# Patient Record
Sex: Female | Born: 1970 | Race: White | Hispanic: No | Marital: Married | Smoking: Never smoker
Health system: Southern US, Community
[De-identification: ages and names within clinical notes are randomized; demographics above are authoritative.]

## PROBLEM LIST (undated history)

## (undated) DIAGNOSIS — E039 Hypothyroidism, unspecified: Secondary | ICD-10-CM

## (undated) DIAGNOSIS — E663 Overweight: Secondary | ICD-10-CM

## (undated) DIAGNOSIS — C801 Malignant (primary) neoplasm, unspecified: Secondary | ICD-10-CM

## (undated) DIAGNOSIS — C73 Malignant neoplasm of thyroid gland: Secondary | ICD-10-CM

## (undated) DIAGNOSIS — R609 Edema, unspecified: Secondary | ICD-10-CM

## (undated) DIAGNOSIS — E785 Hyperlipidemia, unspecified: Secondary | ICD-10-CM

## (undated) HISTORY — PX: CHOLECYSTECTOMY: SHX55

## (undated) HISTORY — DX: Hyperlipidemia, unspecified: E78.5

## (undated) HISTORY — PX: TUBAL LIGATION: SHX77

## (undated) HISTORY — PX: TONSILECTOMY, ADENOIDECTOMY, BILATERAL MYRINGOTOMY AND TUBES: SHX2538

## (undated) HISTORY — DX: Malignant neoplasm of thyroid gland: C73

## (undated) HISTORY — DX: Overweight: E66.3

## (undated) HISTORY — DX: Hypothyroidism, unspecified: E03.9

## (undated) HISTORY — PX: VAGINAL HYSTERECTOMY: SUR661

## (undated) HISTORY — PX: THYROIDECTOMY: SHX17

## (undated) HISTORY — DX: Edema, unspecified: R60.9

---

## 2009-02-19 ENCOUNTER — Ambulatory Visit: Payer: Self-pay | Admitting: Otolaryngology

## 2015-02-08 DIAGNOSIS — C801 Malignant (primary) neoplasm, unspecified: Secondary | ICD-10-CM

## 2015-02-08 HISTORY — DX: Malignant (primary) neoplasm, unspecified: C80.1

## 2015-07-28 ENCOUNTER — Other Ambulatory Visit: Payer: Self-pay | Admitting: Family Medicine

## 2015-07-28 DIAGNOSIS — Z1231 Encounter for screening mammogram for malignant neoplasm of breast: Secondary | ICD-10-CM

## 2015-07-29 ENCOUNTER — Other Ambulatory Visit: Payer: Self-pay | Admitting: Family Medicine

## 2015-07-29 DIAGNOSIS — E049 Nontoxic goiter, unspecified: Secondary | ICD-10-CM

## 2015-08-04 ENCOUNTER — Ambulatory Visit
Admission: RE | Admit: 2015-08-04 | Discharge: 2015-08-04 | Disposition: A | Discharge status: Home / Self Care | Payer: BLUE CROSS/BLUE SHIELD | Source: Ambulatory Visit | Attending: Family Medicine | Admitting: Family Medicine

## 2015-08-04 DIAGNOSIS — E049 Nontoxic goiter, unspecified: Secondary | ICD-10-CM

## 2015-08-18 ENCOUNTER — Ambulatory Visit: Payer: Self-pay | Attending: Family Medicine

## 2015-10-05 DIAGNOSIS — E041 Nontoxic single thyroid nodule: Secondary | ICD-10-CM | POA: Insufficient documentation

## 2016-04-08 ENCOUNTER — Other Ambulatory Visit: Payer: Self-pay | Admitting: Family Medicine

## 2016-04-08 DIAGNOSIS — Z1231 Encounter for screening mammogram for malignant neoplasm of breast: Secondary | ICD-10-CM

## 2016-05-10 ENCOUNTER — Encounter: Payer: Self-pay | Admitting: Radiology

## 2016-05-10 ENCOUNTER — Ambulatory Visit
Admission: RE | Admit: 2016-05-10 | Discharge: 2016-05-10 | Disposition: A | Discharge status: Home / Self Care | Payer: BLUE CROSS/BLUE SHIELD | Source: Ambulatory Visit | Attending: Family Medicine | Admitting: Family Medicine

## 2016-05-10 DIAGNOSIS — Z1231 Encounter for screening mammogram for malignant neoplasm of breast: Secondary | ICD-10-CM | POA: Diagnosis present

## 2016-05-10 DIAGNOSIS — N6320 Unspecified lump in the left breast, unspecified quadrant: Secondary | ICD-10-CM | POA: Diagnosis not present

## 2016-05-10 HISTORY — DX: Malignant (primary) neoplasm, unspecified: C80.1

## 2016-05-12 ENCOUNTER — Other Ambulatory Visit: Payer: Self-pay | Admitting: Family Medicine

## 2016-05-12 DIAGNOSIS — N6320 Unspecified lump in the left breast, unspecified quadrant: Secondary | ICD-10-CM

## 2016-05-12 DIAGNOSIS — R928 Other abnormal and inconclusive findings on diagnostic imaging of breast: Secondary | ICD-10-CM

## 2016-05-12 DIAGNOSIS — N6489 Other specified disorders of breast: Secondary | ICD-10-CM

## 2016-05-19 ENCOUNTER — Ambulatory Visit
Admission: RE | Admit: 2016-05-19 | Discharge: 2016-05-19 | Disposition: A | Discharge status: Home / Self Care | Payer: BLUE CROSS/BLUE SHIELD | Source: Ambulatory Visit | Attending: Family Medicine | Admitting: Family Medicine

## 2016-05-19 DIAGNOSIS — R928 Other abnormal and inconclusive findings on diagnostic imaging of breast: Secondary | ICD-10-CM

## 2016-05-19 DIAGNOSIS — N6489 Other specified disorders of breast: Secondary | ICD-10-CM | POA: Insufficient documentation

## 2016-05-19 DIAGNOSIS — N6320 Unspecified lump in the left breast, unspecified quadrant: Secondary | ICD-10-CM | POA: Diagnosis not present

## 2016-06-09 ENCOUNTER — Other Ambulatory Visit: Payer: Self-pay | Admitting: Family Medicine

## 2016-06-09 DIAGNOSIS — N6320 Unspecified lump in the left breast, unspecified quadrant: Secondary | ICD-10-CM

## 2016-11-21 ENCOUNTER — Ambulatory Visit
Admission: RE | Admit: 2016-11-21 | Discharge: 2016-11-21 | Disposition: A | Discharge status: Home / Self Care | Payer: BLUE CROSS/BLUE SHIELD | Source: Ambulatory Visit | Attending: Family Medicine | Admitting: Family Medicine

## 2016-11-21 DIAGNOSIS — N6320 Unspecified lump in the left breast, unspecified quadrant: Secondary | ICD-10-CM | POA: Insufficient documentation

## 2016-11-24 ENCOUNTER — Other Ambulatory Visit: Payer: Self-pay | Admitting: Family Medicine

## 2016-11-24 DIAGNOSIS — N6320 Unspecified lump in the left breast, unspecified quadrant: Secondary | ICD-10-CM

## 2017-05-19 ENCOUNTER — Ambulatory Visit
Admission: RE | Admit: 2017-05-19 | Discharge: 2017-05-19 | Disposition: A | Discharge status: Home / Self Care | Payer: BLUE CROSS/BLUE SHIELD | Source: Ambulatory Visit | Attending: Family Medicine | Admitting: Family Medicine

## 2017-05-19 DIAGNOSIS — N6324 Unspecified lump in the left breast, lower inner quadrant: Secondary | ICD-10-CM | POA: Diagnosis not present

## 2017-05-19 DIAGNOSIS — N6320 Unspecified lump in the left breast, unspecified quadrant: Secondary | ICD-10-CM | POA: Diagnosis present

## 2018-05-05 IMAGING — US US BREAST*L* LIMITED INC AXILLA
1 series · 7 of 7 positions shown · non-contrast
Comparison: Previous exam(s).

CLINICAL DATA: 46-year-old female presenting for first six-month
follow-up of a probably benign left breast mass.

EXAM:
2D DIGITAL DIAGNOSTIC LEFT MAMMOGRAM WITH CAD AND ADJUNCT TOMO
ULTRASOUND LEFT BREAST

[Series 1: us breast*left* limited inc axilla · 0.05mm/px · 7 of 7 slices shown]
[im 1/7]
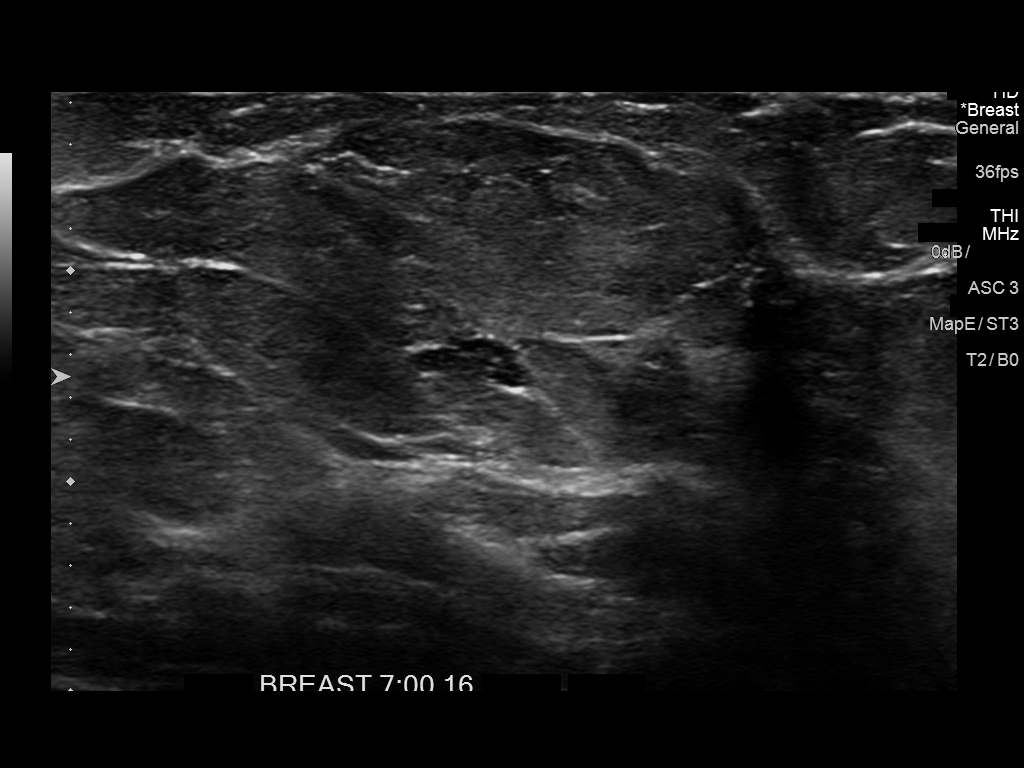
[im 2/7]
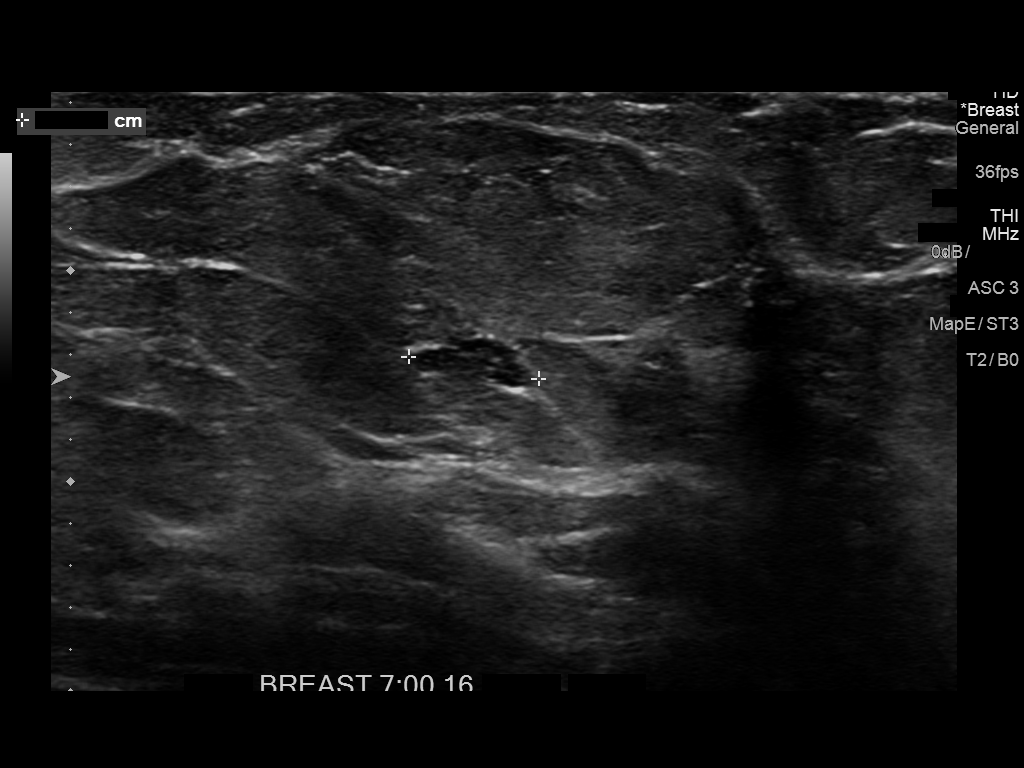
[im 3/7]
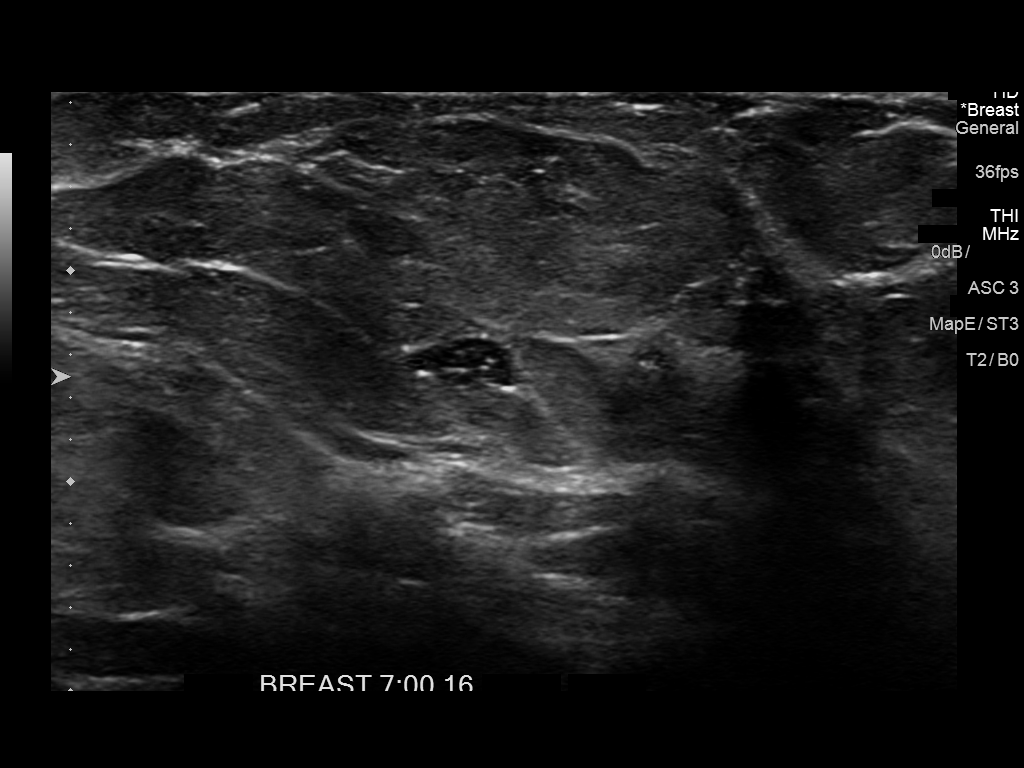
[im 4/7]
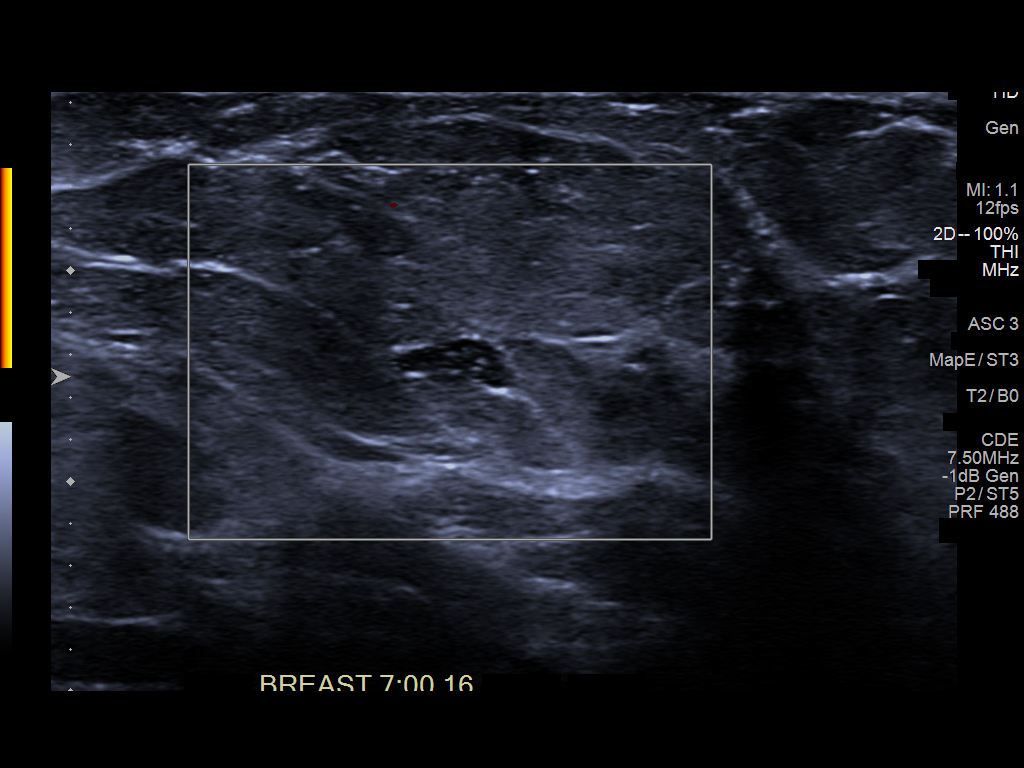
[im 5/7]
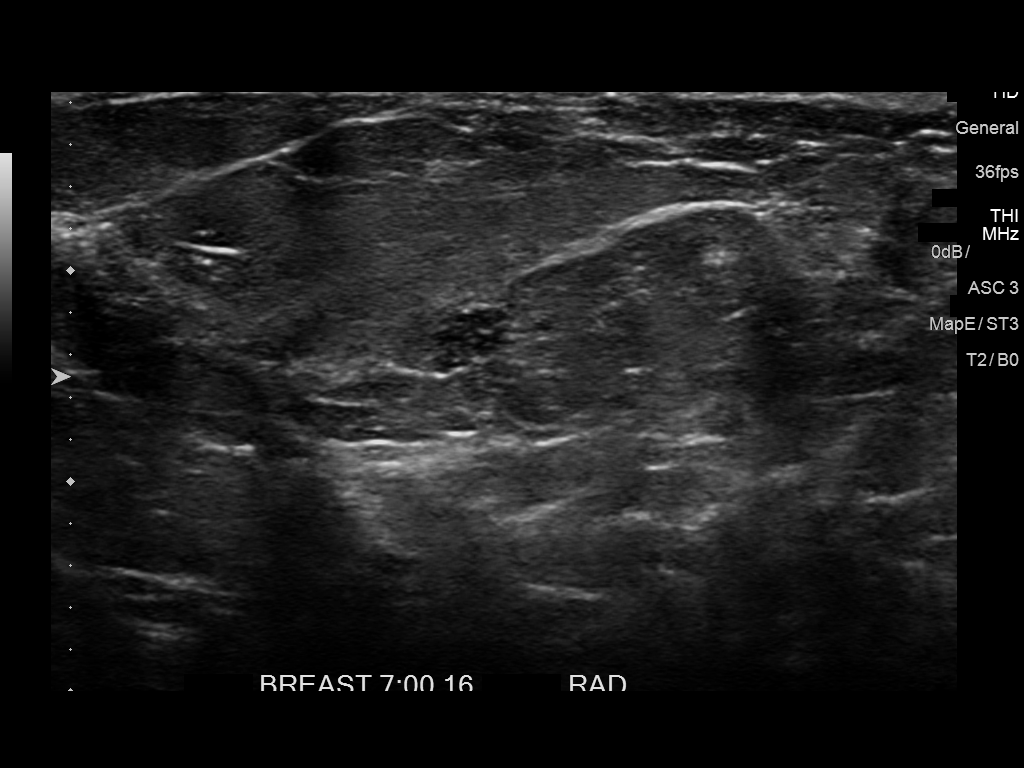
[im 6/7]
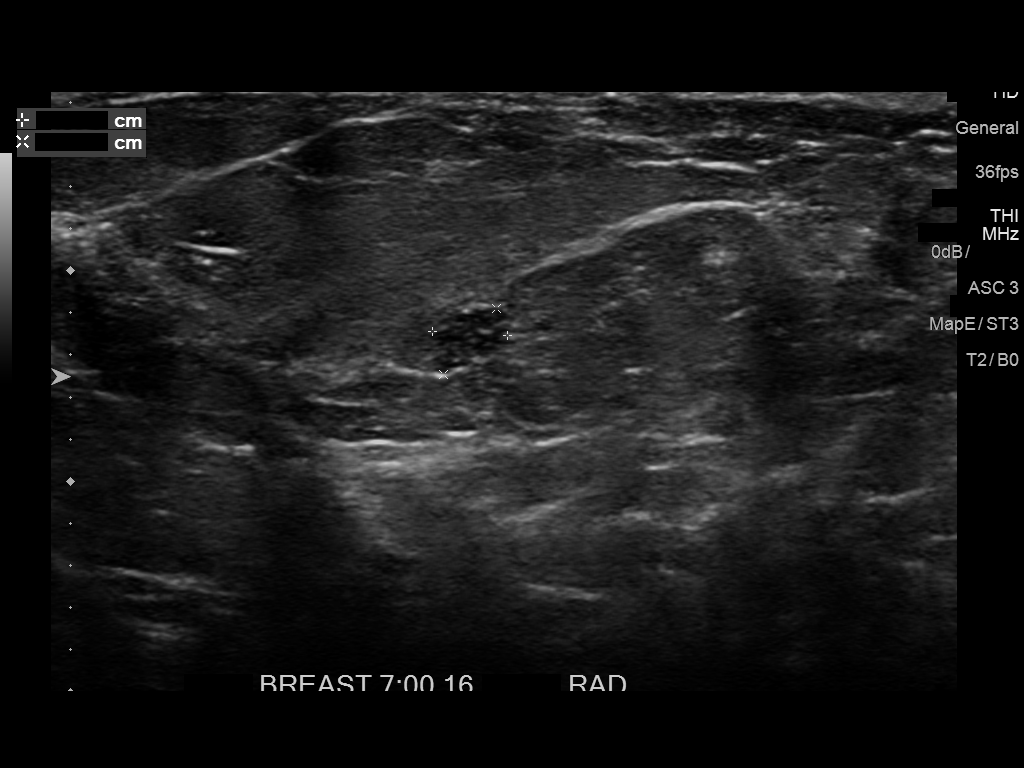
[im 7/7]
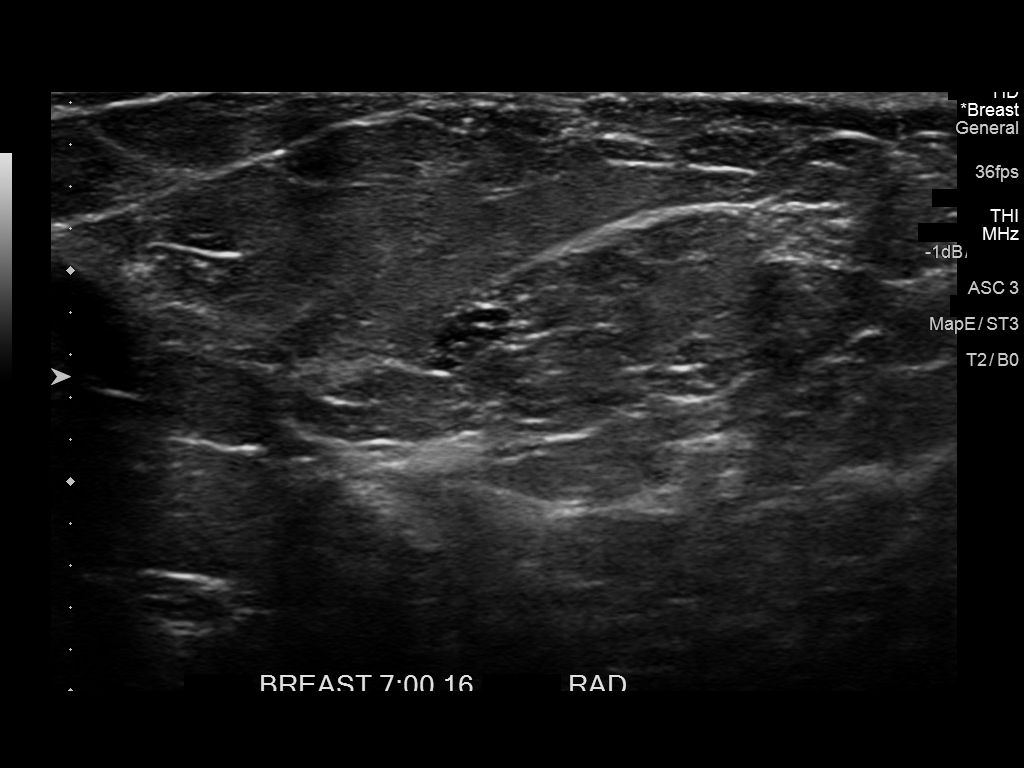

[7 of 7 positions shown; findings below may reference images not displayed]

ACR Breast Density Category c: The breast tissue is heterogeneously
dense, which may obscure small masses.
FINDINGS: A stable circumscribed masses identified in the inferior medial left
breast posteriorly. No other suspicious mammographic findings are
identified.

Mammographic images were processed with CAD.

Targeted ultrasound is performed, showing a stable, circumscribed
heterogeneous mass at the 7 o'clock position 16 cm from the nipple.
It measures 0.6 x 0.4 x 0.4 cm. There is no significant internal
vascularity.
IMPRESSION: Stable, probably benign left breast mass likely representing a cyst
cluster. Continued six-month follow-up recommended to coincide with
the patient's annual bilateral mammogram.

RECOMMENDATION:
Diagnostic bilateral mammogram and left breast ultrasound in 6
months.

I have discussed the findings and recommendations with the patient.
Results were also provided in writing at the conclusion of the
visit. If applicable, a reminder letter will be sent to the patient
regarding the next appointment.

BI-RADS CATEGORY  3: Probably benign.

## 2018-10-05 ENCOUNTER — Other Ambulatory Visit: Payer: Self-pay

## 2018-10-31 IMAGING — US US BREAST*L* LIMITED INC AXILLA
1 series · 8 of 8 positions shown · non-contrast
Comparison: Previous exam(s).

CLINICAL DATA: Follow-up of probably benign left breast 7 o'clock
nodule.

EXAM:
DIGITAL DIAGNOSTIC BILATERAL MAMMOGRAM WITH CAD AND TOMO
ULTRASOUND LEFT BREAST

[Series 1: us breast*left* limited inc axilla · 0.04mm/px · 8 of 8 slices shown]
[im 1/8]
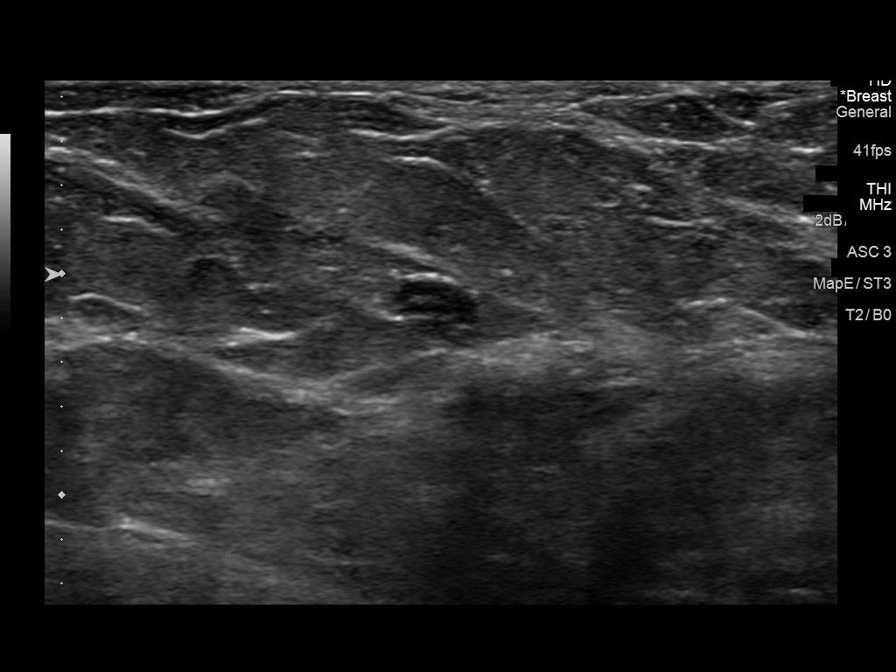
[im 2/8]
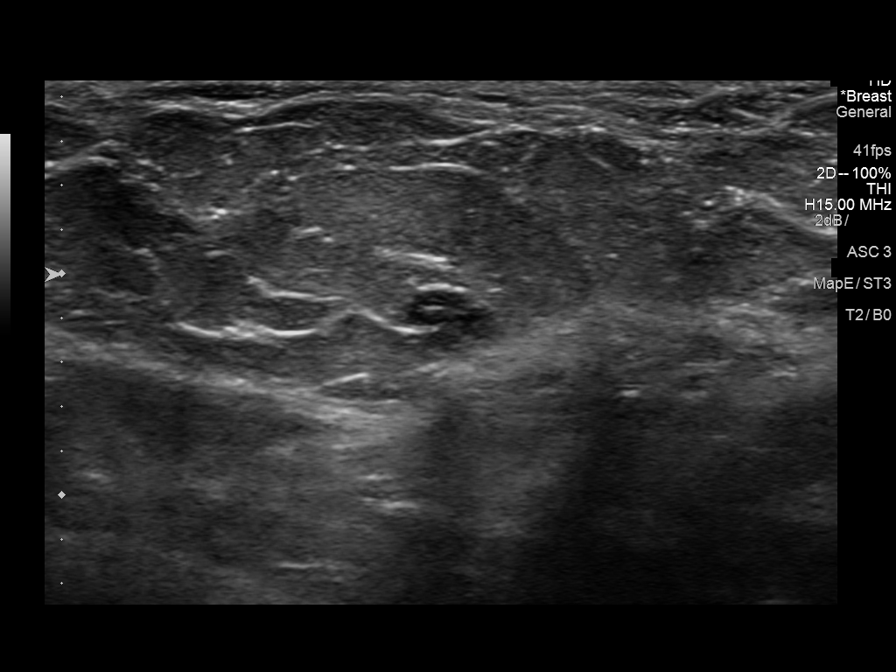
[im 3/8]
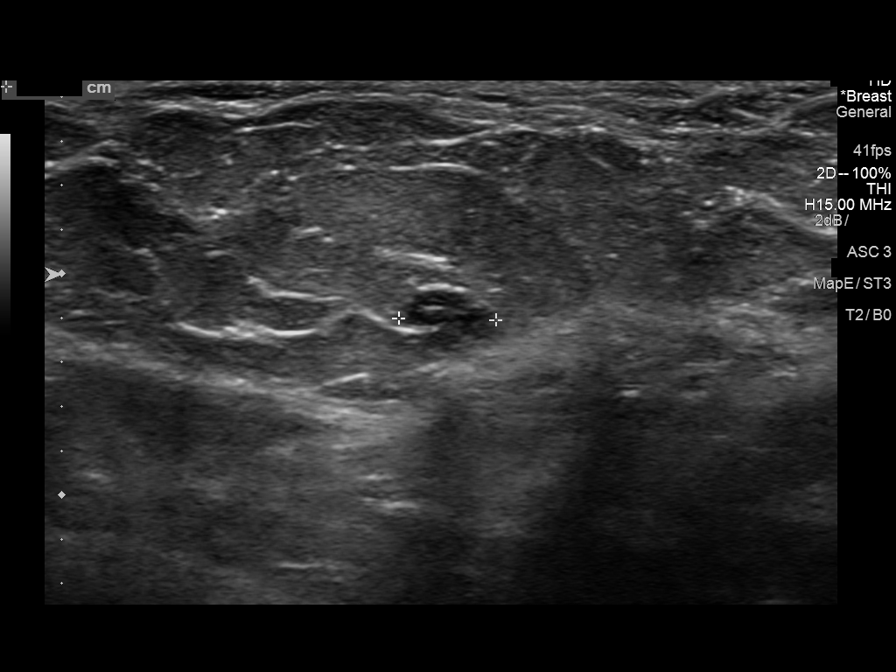
[im 4/8]
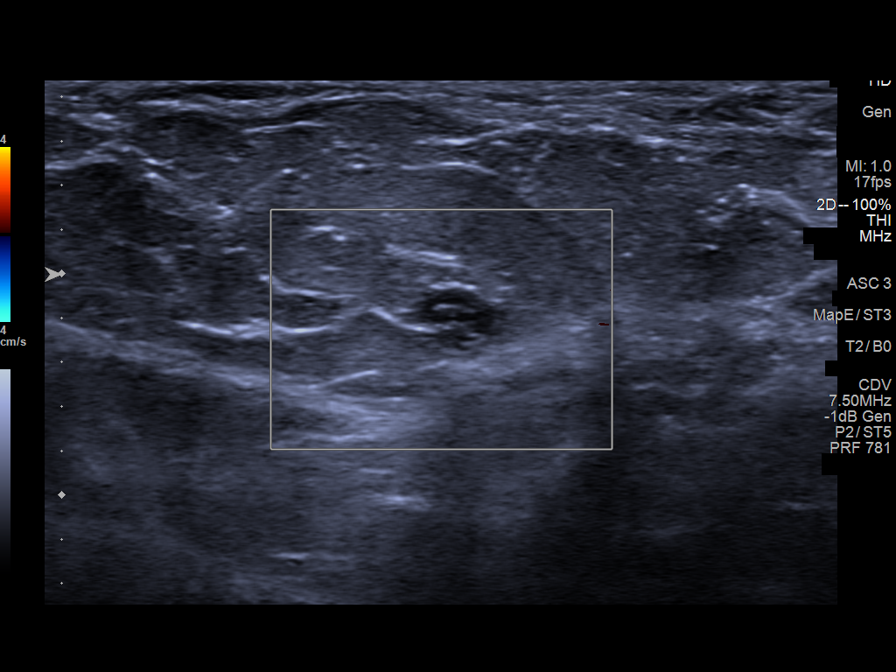
[im 5/8]
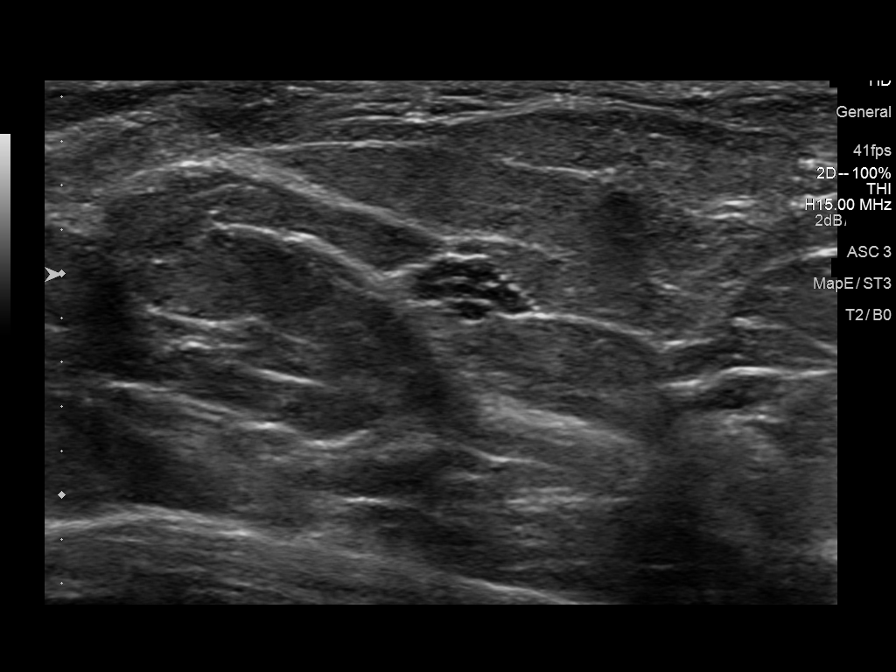
[im 6/8]
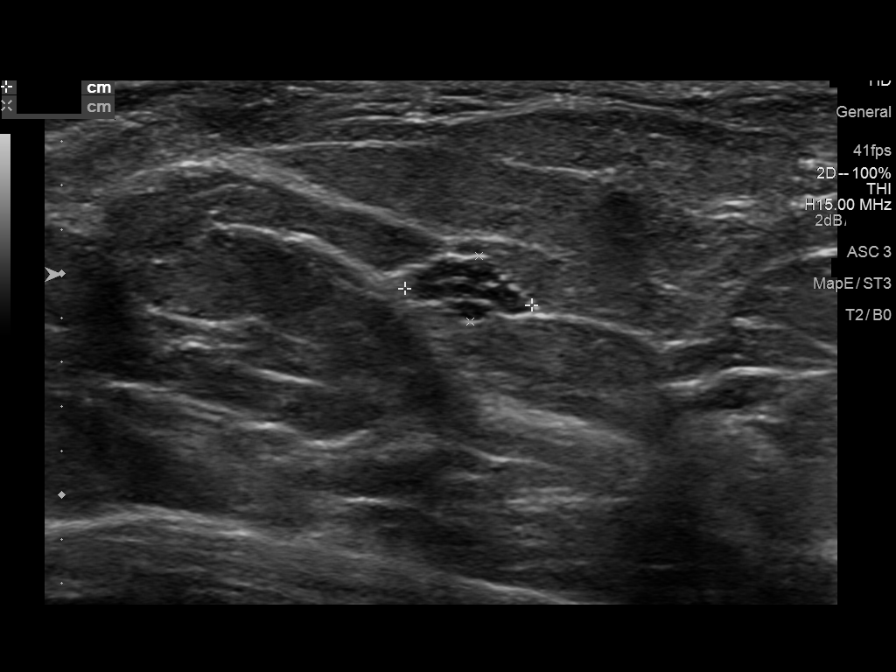
[im 7/8]
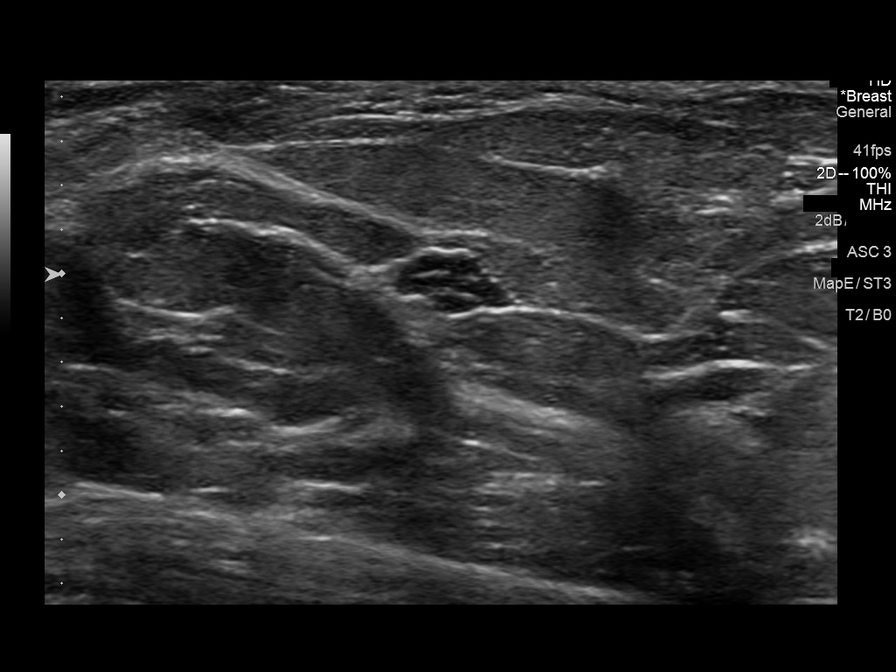
[im 8/8]
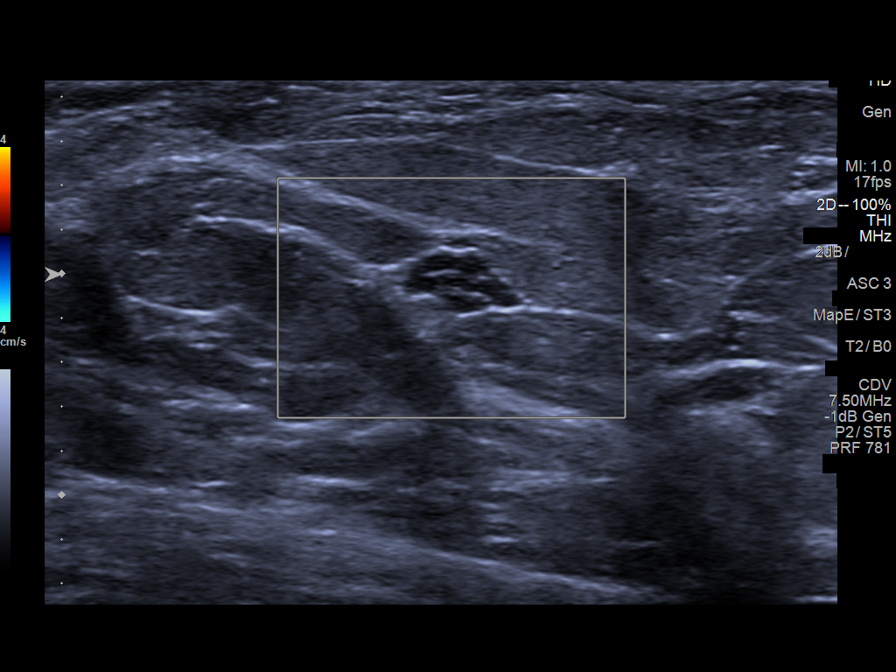

[8 of 8 positions shown; findings below may reference images not displayed]

ACR Breast Density Category c: The breast tissue is heterogeneously
dense, which may obscure small masses.
FINDINGS: The mammographically, there are no suspicious masses, areas of
architectural distortion or microcalcifications in either breast.
The subcentimeter nodule in the left lateral breast seen on [facility]
and November 2016 mammogram is no longer apparent.

Mammographic images were processed with CAD.

On physical exam, no suspicious masses are palpated.

Targeted ultrasound is performed, showing slightly decreased in size
cluster of microcysts in the left 7 o'clock breast 16 cm from the
nipple measuring 0.6 x 0.3 x 0.4 cm. The decrease in size supports
benign causes.
IMPRESSION: Benign left breast probable cluster of microcysts.

No mammographic evidence of malignancy in either breast.

RECOMMENDATION:
Screening mammogram in one year.(Code:VF-7-8JY)

I have discussed the findings and recommendations with the patient.
Results were also provided in writing at the conclusion of the
visit. If applicable, a reminder letter will be sent to the patient
regarding the next appointment.

BI-RADS CATEGORY  2: Benign.

## 2019-06-10 ENCOUNTER — Other Ambulatory Visit: Payer: Self-pay

## 2019-06-27 ENCOUNTER — Other Ambulatory Visit: Payer: Self-pay

## 2019-06-28 MED ORDER — LEVOTHYROXINE SODIUM 175 MCG PO TABS: 175.0000 ug | ORAL_TABLET | Freq: Every day | ORAL | 1 refills | Status: DC

## 2019-07-09 ENCOUNTER — Ambulatory Visit: Payer: Self-pay

## 2019-07-09 ENCOUNTER — Other Ambulatory Visit: Payer: Self-pay

## 2019-07-09 DIAGNOSIS — Z0184 Encounter for antibody response examination: Secondary | ICD-10-CM

## 2019-07-09 DIAGNOSIS — Z0000 Encounter for general adult medical examination without abnormal findings: Secondary | ICD-10-CM

## 2019-07-09 LAB — POCT URINALYSIS DIPSTICK
Bilirubin, UA: NEGATIVE
Blood, UA: NEGATIVE
Glucose, UA: NEGATIVE
Ketones, UA: NEGATIVE
Leukocytes, UA: NEGATIVE
Nitrite, UA: NEGATIVE
Protein, UA: NEGATIVE
Spec Grav, UA: >=1.03 — AB (ref 1.010–1.025)
Urobilinogen, UA: 0.2 E.U./dL
pH, UA: 6 (ref 5.0–8.0)

## 2019-07-10 LAB — CMP12+LP+TP+TSH+6AC+CBC/D/PLT
ALT: 14 IU/L (ref 0–32)
AST: 13 IU/L (ref 0–40)
Albumin/Globulin Ratio: 1.3 (ref 1.2–2.2)
Albumin: 4 g/dL (ref 3.8–4.8)
Alkaline Phosphatase: 49 IU/L (ref 48–121)
BUN/Creatinine Ratio: 12 (ref 9–23)
BUN: 11 mg/dL (ref 6–24)
Basophils Absolute: 0 10*3/uL (ref 0.0–0.2)
Basos: 0 %
Bilirubin Total: 0.3 mg/dL (ref 0.0–1.2)
Calcium: 9.1 mg/dL (ref 8.7–10.2)
Chloride: 103 mmol/L (ref 96–106)
Chol/HDL Ratio: 4.6 ratio — ABNORMAL HIGH (ref 0.0–4.4)
Cholesterol, Total: 223 mg/dL — ABNORMAL HIGH (ref 100–199)
Creatinine, Ser: 0.91 mg/dL (ref 0.57–1.00)
EOS (ABSOLUTE): 0.1 10*3/uL (ref 0.0–0.4)
Eos: 1 %
Estimated CHD Risk: 1.2 times avg. — ABNORMAL HIGH (ref 0.0–1.0)
Free Thyroxine Index: 2.6 (ref 1.2–4.9)
GFR calc Af Amer: 86 mL/min/{1.73_m2} (ref >59)
GFR calc non Af Amer: 75 mL/min/{1.73_m2} (ref >59)
GGT: 7 IU/L (ref 0–60)
Globulin, Total: 3 g/dL (ref 1.5–4.5)
Glucose: 92 mg/dL (ref 65–99)
HDL: 48 mg/dL (ref >39)
Hematocrit: 39.6 % (ref 34.0–46.6)
Hemoglobin: 13.2 g/dL (ref 11.1–15.9)
Immature Grans (Abs): 0 10*3/uL (ref 0.0–0.1)
Immature Granulocytes: 0 %
Iron: 62 ug/dL (ref 27–159)
LDH: 188 IU/L (ref 119–226)
LDL Chol Calc (NIH): 150 mg/dL — ABNORMAL HIGH (ref 0–99)
Lymphocytes Absolute: 2.6 10*3/uL (ref 0.7–3.1)
Lymphs: 36 %
MCH: 30.3 pg (ref 26.6–33.0)
MCHC: 33.3 g/dL (ref 31.5–35.7)
MCV: 91 fL (ref 79–97)
Monocytes Absolute: 0.5 10*3/uL (ref 0.1–0.9)
Monocytes: 7 %
Neutrophils Absolute: 4 10*3/uL (ref 1.4–7.0)
Neutrophils: 56 %
Phosphorus: 3.8 mg/dL (ref 3.0–4.3)
Platelets: 343 10*3/uL (ref 150–450)
Potassium: 4.4 mmol/L (ref 3.5–5.2)
RBC: 4.35 x10E6/uL (ref 3.77–5.28)
RDW: 12.6 % (ref 11.7–15.4)
Sodium: 141 mmol/L (ref 134–144)
T3 Uptake Ratio: 29 % (ref 24–39)
T4, Total: 9.1 ug/dL (ref 4.5–12.0)
TSH: 0.733 u[IU]/mL (ref 0.450–4.500)
Total Protein: 7 g/dL (ref 6.0–8.5)
Triglycerides: 140 mg/dL (ref 0–149)
Uric Acid: 4.5 mg/dL (ref 2.6–6.2)
VLDL Cholesterol Cal: 25 mg/dL (ref 5–40)
WBC: 7.3 10*3/uL (ref 3.4–10.8)

## 2019-07-10 LAB — T4, FREE: Free T4: 1.56 ng/dL (ref 0.82–1.77)

## 2019-07-10 LAB — T3, FREE: T3, Free: 2.8 pg/mL (ref 2.0–4.4)

## 2019-07-29 ENCOUNTER — Other Ambulatory Visit: Payer: Self-pay

## 2019-07-29 ENCOUNTER — Ambulatory Visit: Payer: Self-pay | Admitting: Emergency Medicine

## 2019-07-29 VITALS — BP 140/85 | HR 72 | Temp 98.5°F | Resp 16 | Ht 68.0 in | Wt 252.0 lb

## 2019-07-29 DIAGNOSIS — Z0000 Encounter for general adult medical examination without abnormal findings: Secondary | ICD-10-CM

## 2019-07-29 MED ORDER — FUROSEMIDE 20 MG PO TABS: 20.0000 mg | ORAL_TABLET | ORAL | 2 refills | Status: DC | PRN

## 2019-07-29 NOTE — Progress Notes (Signed)
I have reviewed the triage vital signs and the nursing notes.   HISTORY  Chief Complaint Employment Physical  HPI Alexa Flores is a 49 y.o. female is here for physical exam.  Patient states that she needs a refill of her Lasix as needed her ankle swelling.  She has been on it before without any difficulties.  Patient also continues to take Celebrex for muscle skeletal pain.       Past Medical History:  Diagnosis Date   Cancer ([place]) 2017   thyroid ca.  Surgery only. No tx needed.     Patient Active Problem List   Diagnosis Date Noted   Thyroid nodule 10/05/2015    No past surgical history on file.  Prior to Admission medications   Medication Sig Start Date End Date Taking? Authorizing Provider  Acetaminophen 500 MG capsule Take 2 tablets by mouth as needed.   Yes [provider]  calcium carbonate (TUMS EX) 750 MG chewable tablet Chew 2 tablets by mouth once. 11/24/15  Yes [provider]  celecoxib (CELEBREX) 100 MG capsule Take 100 mg by mouth 2 (two) times daily. 06/02/19  Yes [provider]  cetirizine (ZYRTEC) 10 MG tablet Take 10 mg by mouth once.   Yes [provider]  Cholecalciferol 125 MCG (5000 UT) TABS Take 10,000 Units by mouth once.   Yes [provider]  furosemide (LASIX) 20 MG tablet Take 1 tablet (20 mg total) by mouth as needed. 07/29/19  Yes Johnn Hai, PA-C  levothyroxine (SYNTHROID) 175 MCG tablet Take 1 tablet (175 mcg total) by mouth daily. 06/28/19  Yes Sable Feil, PA-C  Lysine 500 MG TABS Take 1 tablet by mouth daily.   Yes [provider]  Omega-3 Fatty Acids (FISH OIL) 500 MG CAPS Take 1 capsule by mouth 1 day or 1 dose.   Yes [provider]  vitamin E 180 MG (400 UNITS) capsule Take 400 Units by mouth 1 day or 1 dose.   Yes [provider]    Allergies Azithromycin and Other  No family history on file.  Social History Social History   Tobacco Use    Smoking status: Not on file  Substance Use Topics   Alcohol use: Not on file   Drug use: Not on file    Review of Systems Constitutional: No fever/chills Eyes: No visual changes. ENT: No sore throat. Cardiovascular: Denies chest pain. Respiratory: Denies shortness of breath. Gastrointestinal: No abdominal pain.  No nausea, no vomiting.   Genitourinary: Negative for dysuria. Musculoskeletal: Generalized aching due to degenerative arthritis. Skin: Negative for rash. Neurological: Negative for headaches, focal weakness or numbness.  ____________________________________________   PHYSICAL EXAM:  Constitutional: Alert and oriented. Well appearing and in no acute distress. Eyes: Conjunctivae are normal. PERRL. EOMI. Head: Atraumatic. Nose: No congestion/rhinnorhea. Neck: No stridor.   Cardiovascular: Normal rate, regular rhythm. Grossly normal heart sounds.  Good peripheral circulation. Respiratory: Normal respiratory effort.  No retractions. Lungs CTAB. Gastrointestinal: Soft and nontender. No distention.  Bowel sounds normoactive x4 quadrants. Musculoskeletal: Nontender thoracic or lumbar spine to palpation posteriorly.  Patient is able move upper and lower extremities without any difficulty.  Normal gait was noted.  No deformity of joints.  Bilateral ankles there is some mild soft tissue edema without pitting. Neurologic:  Normal speech and language. No gross focal neurologic deficits are appreciated. No gait instability. Skin:  Skin is warm, dry and intact. No rash noted. Psychiatric: Mood and affect  are normal. Speech and behavior are normal.  ____________________________________________   LABS (all labs ordered are listed, but only abnormal results are displayed)  Labs were discussed with patient. ____________________________________________  EKG  Sinus rhythm with ventricular rate of  70. ____________________________________________ ____________________________________________   FINAL CLINICAL IMPRESSION(S)   Wellness physical.   ED Discharge Orders         Ordered    EKG 12-Lead  Status:  Canceled        07/29/19 1336    EKG 12-Lead        07/29/19 1345    furosemide (LASIX) 20 MG tablet  As needed     Discontinue  Reprint     07/29/19 1407           Note:  This document was prepared using Dragon voice recognition software and may include unintentional dictation errors.

## 2019-07-31 LAB — SPECIMEN STATUS REPORT

## 2019-07-31 LAB — RABIES NEUT.ABS TITRAT.(RFFIT)

## 2019-08-02 DIAGNOSIS — M2550 Pain in unspecified joint: Secondary | ICD-10-CM | POA: Diagnosis not present

## 2019-08-02 DIAGNOSIS — M542 Cervicalgia: Secondary | ICD-10-CM | POA: Diagnosis not present

## 2019-08-02 DIAGNOSIS — M545 Low back pain: Secondary | ICD-10-CM | POA: Diagnosis not present

## 2019-08-02 DIAGNOSIS — M7918 Myalgia, other site: Secondary | ICD-10-CM | POA: Diagnosis not present

## 2019-11-18 ENCOUNTER — Ambulatory Visit: Payer: Self-pay

## 2019-11-18 ENCOUNTER — Other Ambulatory Visit: Payer: Self-pay

## 2019-11-18 DIAGNOSIS — Z0184 Encounter for antibody response examination: Secondary | ICD-10-CM

## 2019-11-18 DIAGNOSIS — Z23 Encounter for immunization: Secondary | ICD-10-CM

## 2019-11-18 MED ORDER — RABIES VACCINE, PCEC IM SUSR
1.0000 mL | Freq: Once | INTRAMUSCULAR | Status: AC
  Administered 2019-11-18: 1 mL via INTRAMUSCULAR

## 2019-11-18 NOTE — Progress Notes (Signed)
Annebelle had Rabies titer on 07/14/19.  Results were >/=0.1 Comment:  Less than 0.1 IU/mL:  Below detection limit >/= 0.1 IU/mL  Kiaraliz already had Rabies pre-vaccine series when pre-employment completed on 04/09/2013. Documentation on physical form states she completed the series in 2013 & had a + Rabies titer in 2014. Because she never supplied [place] a copy of the above information, we obtained a Rabies titer on 05/13/13 & results were ,0.1 IU/mL Less than 0.1 IU/mL : Below detection limit. Received a Rabies booster from our clinic on 08/16/13. Rabies titer (after receiving the booster) on 11/19/13 >/=0.5 IU/mL (>/= 0.5 IU/mL: Equal to or above 0.5 IU/mL)  Presents to clinic today for Rabies booster.  AMD

## 2020-02-11 ENCOUNTER — Other Ambulatory Visit: Payer: Self-pay

## 2020-02-11 DIAGNOSIS — Z1152 Encounter for screening for COVID-19: Secondary | ICD-10-CM

## 2020-02-13 LAB — SARS-COV-2, NAA 2 DAY TAT

## 2020-02-13 LAB — NOVEL CORONAVIRUS, NAA: SARS-CoV-2, NAA: NOT DETECTED

## 2020-06-05 NOTE — Progress Notes (Signed)
Scheduled to complete physical 06/15/20 with Randel Pigg, PA-C.  AMD  Large busted - hard to get leads I & II clear Compared ECG to last year's ECG - same No distress - no CP, no SOB  AMD

## 2020-06-08 ENCOUNTER — Other Ambulatory Visit: Payer: Self-pay

## 2020-06-08 ENCOUNTER — Ambulatory Visit: Payer: Self-pay

## 2020-06-08 DIAGNOSIS — Z0000 Encounter for general adult medical examination without abnormal findings: Secondary | ICD-10-CM

## 2020-06-08 LAB — POCT URINALYSIS DIPSTICK
Bilirubin, UA: NEGATIVE
Blood, UA: NEGATIVE
Glucose, UA: NEGATIVE
Ketones, UA: NEGATIVE
Leukocytes, UA: NEGATIVE
Nitrite, UA: NEGATIVE
Protein, UA: NEGATIVE
Spec Grav, UA: 1.025 (ref 1.010–1.025)
Urobilinogen, UA: 0.2 E.U./dL
pH, UA: 6 (ref 5.0–8.0)

## 2020-06-09 LAB — CMP12+LP+TP+TSH+6AC+CBC/D/PLT
ALT: 23 IU/L (ref 0–32)
AST: 18 IU/L (ref 0–40)
Albumin/Globulin Ratio: 1.5 (ref 1.2–2.2)
Albumin: 3.9 g/dL (ref 3.8–4.8)
Alkaline Phosphatase: 51 IU/L (ref 44–121)
BUN/Creatinine Ratio: 13 (ref 9–23)
BUN: 10 mg/dL (ref 6–24)
Basophils Absolute: 0 10*3/uL (ref 0.0–0.2)
Basos: 0 %
Bilirubin Total: 0.3 mg/dL (ref 0.0–1.2)
Calcium: 8.4 mg/dL — ABNORMAL LOW (ref 8.7–10.2)
Chloride: 104 mmol/L (ref 96–106)
Chol/HDL Ratio: 4.1 ratio (ref 0.0–4.4)
Cholesterol, Total: 232 mg/dL — ABNORMAL HIGH (ref 100–199)
Creatinine, Ser: 0.78 mg/dL (ref 0.57–1.00)
EOS (ABSOLUTE): 0.1 10*3/uL (ref 0.0–0.4)
Eos: 1 %
Estimated CHD Risk: 0.9 times avg. (ref 0.0–1.0)
Free Thyroxine Index: 2.2 (ref 1.2–4.9)
GGT: 8 IU/L (ref 0–60)
Globulin, Total: 2.6 g/dL (ref 1.5–4.5)
Glucose: 80 mg/dL (ref 65–99)
HDL: 56 mg/dL (ref >39)
Hematocrit: 39.1 % (ref 34.0–46.6)
Hemoglobin: 13.4 g/dL (ref 11.1–15.9)
Immature Grans (Abs): 0.1 10*3/uL (ref 0.0–0.1)
Immature Granulocytes: 1 %
Iron: 70 ug/dL (ref 27–159)
LDH: 202 IU/L (ref 119–226)
LDL Chol Calc (NIH): 157 mg/dL — ABNORMAL HIGH (ref 0–99)
Lymphocytes Absolute: 2.8 10*3/uL (ref 0.7–3.1)
Lymphs: 30 %
MCH: 30.7 pg (ref 26.6–33.0)
MCHC: 34.3 g/dL (ref 31.5–35.7)
MCV: 90 fL (ref 79–97)
Monocytes Absolute: 0.6 10*3/uL (ref 0.1–0.9)
Monocytes: 6 %
Neutrophils Absolute: 5.6 10*3/uL (ref 1.4–7.0)
Neutrophils: 62 %
Phosphorus: 3.3 mg/dL (ref 3.0–4.3)
Platelets: 370 10*3/uL (ref 150–450)
Potassium: 4.1 mmol/L (ref 3.5–5.2)
RBC: 4.37 x10E6/uL (ref 3.77–5.28)
RDW: 12.8 % (ref 11.7–15.4)
Sodium: 139 mmol/L (ref 134–144)
T3 Uptake Ratio: 26 % (ref 24–39)
T4, Total: 8.6 ug/dL (ref 4.5–12.0)
TSH: 2.38 u[IU]/mL (ref 0.450–4.500)
Total Protein: 6.5 g/dL (ref 6.0–8.5)
Triglycerides: 107 mg/dL (ref 0–149)
Uric Acid: 4.8 mg/dL (ref 2.6–6.2)
VLDL Cholesterol Cal: 19 mg/dL (ref 5–40)
WBC: 9.1 10*3/uL (ref 3.4–10.8)
eGFR: 94 mL/min/{1.73_m2} (ref >59)

## 2020-06-09 LAB — T4, FREE: Free T4: 1.29 ng/dL (ref 0.82–1.77)

## 2020-06-15 ENCOUNTER — Ambulatory Visit: Payer: Self-pay | Admitting: Physician Assistant

## 2020-06-15 ENCOUNTER — Encounter: Payer: Self-pay | Admitting: Physician Assistant

## 2020-06-15 ENCOUNTER — Other Ambulatory Visit: Payer: Self-pay

## 2020-06-15 VITALS — BP 136/98 | HR 83 | Temp 98.4°F | Resp 12 | Ht 67.0 in | Wt 262.0 lb

## 2020-06-15 DIAGNOSIS — Z0000 Encounter for general adult medical examination without abnormal findings: Secondary | ICD-10-CM

## 2020-06-15 MED ORDER — FUROSEMIDE 20 MG PO TABS: 20.0000 mg | ORAL_TABLET | Freq: Every day | ORAL | 3 refills | Status: DC

## 2020-06-15 MED ORDER — DICLOFENAC SODIUM 1 % EX GEL: 2.0000 g | Freq: Four times a day (QID) | CUTANEOUS | 3 refills | Status: AC

## 2020-06-15 NOTE — Progress Notes (Signed)
Weight gain - swollen ankle States she is out of Lasix  Endocrinologist - Was supposed to go in  03/2020 but was canceled due to Covid  States last year TSH was lower & she felt much better than she does now.  AMD

## 2020-06-15 NOTE — Progress Notes (Signed)
   Subjective: Annual physical exam    Patient ID: Alexa Flores, female    DOB: [birth year], 50 y.o.   MRN: [id number]  HPI Patient presents for annual physical exam.  Patient was concerned for weight gain, peripheral edema, and hypothyroidism. Patient has not taken Lasix in over a year.  When asked she says she ran out of prescriptions and did not call to have a refill.   Review of Systems Hypothyroidism, weight gain, and ankle edema.    Objective:   Physical Exam No acute distress.  Temperature is 98.4, pulse 83, respiration 12, BP is 136/98, BMI is 41.04.  There has been a 10 pound weight gain since her last physical. HEENT is unremarkable.  Neck is supple adenectomy or bruits.  Lungs are clear to auscultation.  Heart regular rate and rhythm. Abdomen distended secondary to body habitus, normoactive bowel sounds, soft, nontender to palpation. No obvious deformity to the upper or lower extremities.  Patient has full and equal range of motion of the upper and lower extremities.  Bilateral mild pedal edema. No cervical or lumbar spine deformity.  Patient has full and equal range of motion cervical lumbar spine. Cranial nerves II through XII grossly intact.       Assessment & Plan: Well exam.   Discussed lab results with patient.  This will increase of patient cholesterol from 223 to 232.  HDL has increased from 48 to 256.  Triglycerides has decreased from 140 to 03/10/2005 and LDL has increased from 150 to 157.  Patient TSH has increased from 0.753 3to 2.380, T4 has decreased from 9.1 to 8.6, and T3 has decreased from 29 to 226.  Patient thyroid function results will be sent to her endocrinologist.  Patient prescription for Lasix will be refilled.  Patient will follow-up in 3 months.

## 2021-01-05 DIAGNOSIS — C73 Malignant neoplasm of thyroid gland: Secondary | ICD-10-CM | POA: Diagnosis not present

## 2021-01-05 DIAGNOSIS — E890 Postprocedural hypothyroidism: Secondary | ICD-10-CM | POA: Diagnosis not present

## 2021-01-05 DIAGNOSIS — Z23 Encounter for immunization: Secondary | ICD-10-CM | POA: Diagnosis not present

## 2021-02-26 DIAGNOSIS — C73 Malignant neoplasm of thyroid gland: Secondary | ICD-10-CM | POA: Diagnosis not present

## 2021-02-26 DIAGNOSIS — E890 Postprocedural hypothyroidism: Secondary | ICD-10-CM | POA: Diagnosis not present

## 2021-05-24 ENCOUNTER — Ambulatory Visit
Admission: RE | Admit: 2021-05-24 | Discharge: 2021-05-24 | Disposition: A | Discharge status: Home / Self Care | Payer: 59 | Source: Ambulatory Visit | Attending: Physician Assistant | Admitting: Physician Assistant

## 2021-05-24 ENCOUNTER — Ambulatory Visit
Admission: RE | Admit: 2021-05-24 | Discharge: 2021-05-24 | Disposition: A | Discharge status: Home / Self Care | Payer: 59 | Attending: Physician Assistant | Admitting: Physician Assistant

## 2021-05-24 ENCOUNTER — Ambulatory Visit: Payer: Self-pay | Admitting: Physician Assistant

## 2021-05-24 ENCOUNTER — Encounter: Payer: Self-pay | Admitting: Physician Assistant

## 2021-05-24 VITALS — BP 136/97 | HR 82 | Temp 97.8°F | Resp 16 | Wt 260.0 lb

## 2021-05-24 DIAGNOSIS — M25562 Pain in left knee: Secondary | ICD-10-CM | POA: Diagnosis not present

## 2021-05-24 NOTE — Progress Notes (Signed)
? ?  Subjective: Left knee pain  ? ? Patient ID: Alexa Flores, female    DOB: 1970-03-31, 51 y.o.   MRN: [id number] ? ?HPI ?Patient complain of 3 weeks of nontraumatic left knee pain.  Patient states she was walking when she felt a pop medial aspect of her left knee.  Patient states since incident she has had intermittent "locking" of the left knee.  Denies loss sensation or loss of strength.  Rates the pain as a 5/10.  Describes the pain as "achy".  Mild relief anti-inflammatory medications.  Past medical history remarkable for hypothyroidism peripheral edema. ? ? ?Review of Systems ?Negative except for complaint ?   ?Objective:  ? Physical Exam ? ?Temperature is 97.8, respiration 16, pulse 82, BP is 136/97, and patient 90% O2 sat on room air.  Patient weighs 260 pounds and BMI is 40.72. ?No obvious deformity to the right knee.  Mild edema.  Mild guarding palpation of the anterior patella.  No laxity with stress testing.  Patient had decreased patellar reflex in comparison to right patellar reflex. ? ?   ?Assessment & Plan: Left knee pain  ?Discussed with patient further evaluation will start with x-ray and will follow-up in 48 hours. ? ?

## 2021-05-24 NOTE — Progress Notes (Signed)
Left knee pain x3 weeks ?No known injury ? ?Taking ibuprofen (only at night) taking 4 (200 mg ) tablets ?Using generic Voltaren ?Ice & elevation at home ?No compression ? ?Hasn't taken any ibuprofen today ? ?Hurts to straighten leg ? ?AMD ?

## 2021-06-21 DIAGNOSIS — Z0000 Encounter for general adult medical examination without abnormal findings: Secondary | ICD-10-CM

## 2021-06-21 DIAGNOSIS — Z0184 Encounter for antibody response examination: Secondary | ICD-10-CM

## 2021-06-28 ENCOUNTER — Encounter: Payer: 59 | Admitting: Physician Assistant

## 2021-07-22 ENCOUNTER — Ambulatory Visit: Payer: Self-pay

## 2021-07-22 DIAGNOSIS — Z0000 Encounter for general adult medical examination without abnormal findings: Secondary | ICD-10-CM

## 2021-07-22 DIAGNOSIS — Z0184 Encounter for antibody response examination: Secondary | ICD-10-CM

## 2021-07-22 LAB — POCT URINALYSIS DIPSTICK
Bilirubin, UA: NEGATIVE
Blood, UA: NEGATIVE
Glucose, UA: NEGATIVE
Ketones, UA: NEGATIVE
Leukocytes, UA: NEGATIVE
Nitrite, UA: NEGATIVE
Protein, UA: POSITIVE — AB
Spec Grav, UA: >=1.03 — AB (ref 1.010–1.025)
Urobilinogen, UA: 0.2 E.U./dL
pH, UA: 5.5 (ref 5.0–8.0)

## 2021-07-22 NOTE — Progress Notes (Signed)
Pt presents today for physical labs, will return to clinic for scheduled physical.  

## 2021-07-23 LAB — CMP12+LP+TP+TSH+6AC+CBC/D/PLT
ALT: 19 IU/L (ref 0–32)
AST: 14 IU/L (ref 0–40)
Albumin/Globulin Ratio: 1.6 (ref 1.2–2.2)
Albumin: 4.4 g/dL (ref 3.8–4.8)
Alkaline Phosphatase: 58 IU/L (ref 44–121)
BUN/Creatinine Ratio: 12 (ref 9–23)
BUN: 11 mg/dL (ref 6–24)
Basophils Absolute: 0 10*3/uL (ref 0.0–0.2)
Basos: 0 %
Bilirubin Total: 0.6 mg/dL (ref 0.0–1.2)
Calcium: 9 mg/dL (ref 8.7–10.2)
Chloride: 102 mmol/L (ref 96–106)
Chol/HDL Ratio: 5.1 ratio — ABNORMAL HIGH (ref 0.0–4.4)
Cholesterol, Total: 299 mg/dL — ABNORMAL HIGH (ref 100–199)
Creatinine, Ser: 0.95 mg/dL (ref 0.57–1.00)
EOS (ABSOLUTE): 0.1 10*3/uL (ref 0.0–0.4)
Eos: 1 %
Estimated CHD Risk: 1.3 times avg. — ABNORMAL HIGH (ref 0.0–1.0)
Free Thyroxine Index: 3.2 (ref 1.2–4.9)
GGT: 9 IU/L (ref 0–60)
Globulin, Total: 2.8 g/dL (ref 1.5–4.5)
Glucose: 89 mg/dL (ref 70–99)
HDL: 59 mg/dL (ref >39)
Hematocrit: 41.9 % (ref 34.0–46.6)
Hemoglobin: 14 g/dL (ref 11.1–15.9)
Immature Grans (Abs): 0 10*3/uL (ref 0.0–0.1)
Immature Granulocytes: 0 %
Iron: 100 ug/dL (ref 27–159)
LDH: 193 IU/L (ref 119–226)
LDL Chol Calc (NIH): 224 mg/dL — ABNORMAL HIGH (ref 0–99)
Lymphocytes Absolute: 2.3 10*3/uL (ref 0.7–3.1)
Lymphs: 35 %
MCH: 29.7 pg (ref 26.6–33.0)
MCHC: 33.4 g/dL (ref 31.5–35.7)
MCV: 89 fL (ref 79–97)
Monocytes Absolute: 0.4 10*3/uL (ref 0.1–0.9)
Monocytes: 6 %
Neutrophils Absolute: 3.8 10*3/uL (ref 1.4–7.0)
Neutrophils: 58 %
Phosphorus: 3.7 mg/dL (ref 3.0–4.3)
Platelets: 358 10*3/uL (ref 150–450)
Potassium: 4 mmol/L (ref 3.5–5.2)
RBC: 4.72 x10E6/uL (ref 3.77–5.28)
RDW: 12.7 % (ref 11.7–15.4)
Sodium: 139 mmol/L (ref 134–144)
T3 Uptake Ratio: 31 % (ref 24–39)
T4, Total: 10.3 ug/dL (ref 4.5–12.0)
TSH: 4.49 u[IU]/mL (ref 0.450–4.500)
Total Protein: 7.2 g/dL (ref 6.0–8.5)
Triglycerides: 97 mg/dL (ref 0–149)
Uric Acid: 5.5 mg/dL (ref 2.6–6.2)
VLDL Cholesterol Cal: 16 mg/dL (ref 5–40)
WBC: 6.7 10*3/uL (ref 3.4–10.8)
eGFR: 73 mL/min/{1.73_m2} (ref >59)

## 2021-07-23 LAB — T4: T4, Total: 9.7 ug/dL (ref 4.5–12.0)

## 2021-07-23 NOTE — Progress Notes (Signed)
Labs faxed to [facility] Fossa, [place] ([facility]) 07/23/21 to [phone] as requested by Leonor Liv.  AMD

## 2021-07-28 DIAGNOSIS — M25562 Pain in left knee: Secondary | ICD-10-CM | POA: Diagnosis not present

## 2021-07-29 ENCOUNTER — Ambulatory Visit: Payer: Self-pay

## 2021-07-29 ENCOUNTER — Encounter: Payer: Self-pay | Admitting: Physician Assistant

## 2021-07-29 ENCOUNTER — Ambulatory Visit: Payer: Self-pay | Admitting: Physician Assistant

## 2021-07-29 VITALS — BP 125/97 | HR 92 | Temp 97.6°F | Resp 16 | Ht 67.0 in | Wt 245.0 lb

## 2021-07-29 DIAGNOSIS — R808 Other proteinuria: Secondary | ICD-10-CM

## 2021-07-29 DIAGNOSIS — M25562 Pain in left knee: Secondary | ICD-10-CM

## 2021-07-29 DIAGNOSIS — Z0000 Encounter for general adult medical examination without abnormal findings: Secondary | ICD-10-CM

## 2021-07-29 LAB — POCT URINALYSIS DIPSTICK
Bilirubin, UA: NEGATIVE
Blood, UA: NEGATIVE
Glucose, UA: NEGATIVE
Ketones, UA: NEGATIVE
Leukocytes, UA: NEGATIVE
Nitrite, UA: NEGATIVE
Protein, UA: POSITIVE — AB
Spec Grav, UA: >=1.03 — AB (ref 1.010–1.025)
Urobilinogen, UA: 0.2 E.U./dL
pH, UA: 5.5 (ref 5.0–8.0)

## 2021-07-29 NOTE — Progress Notes (Signed)
Pt presents today with both ears impacted by wax. I performed ear lavage and Both ears are wax free. Ron smith,Pa-c approved.

## 2021-07-29 NOTE — Progress Notes (Signed)
Pt presents today to complete physical.

## 2021-08-04 DIAGNOSIS — M25562 Pain in left knee: Secondary | ICD-10-CM | POA: Diagnosis not present

## 2021-08-09 DIAGNOSIS — M25562 Pain in left knee: Secondary | ICD-10-CM | POA: Diagnosis not present

## 2021-08-13 DIAGNOSIS — M25562 Pain in left knee: Secondary | ICD-10-CM | POA: Insufficient documentation

## 2021-08-29 DIAGNOSIS — E890 Postprocedural hypothyroidism: Secondary | ICD-10-CM | POA: Insufficient documentation

## 2021-08-29 DIAGNOSIS — C73 Malignant neoplasm of thyroid gland: Secondary | ICD-10-CM | POA: Insufficient documentation

## 2021-08-30 DIAGNOSIS — C73 Malignant neoplasm of thyroid gland: Secondary | ICD-10-CM | POA: Diagnosis not present

## 2021-08-30 DIAGNOSIS — E890 Postprocedural hypothyroidism: Secondary | ICD-10-CM | POA: Diagnosis not present

## 2021-09-02 ENCOUNTER — Telehealth: Payer: Self-pay

## 2021-09-02 NOTE — Telephone Encounter (Signed)
An order for a Rabies titer is in Epic for 07/22/2021 when Alexa Flores had her labs drawn for her physical.  It shows pending for results.  Alexa Flores [phone] & the rep assisting me said [facility] didn't receive a requisition for the Rabies titer & they don't have that it was sent to the [facility] for processing.  Alexa Flores needs an appointment to redraw for the Rabies Titer (non-fasting).  AMD

## 2021-09-23 ENCOUNTER — Telehealth: Payer: Self-pay | Admitting: *Deleted

## 2021-09-23 NOTE — Telephone Encounter (Signed)
-----   Message from Aliene Altes, RN sent at 09/02/2021  8:58 AM EDT ----- Regarding: Rabies Titer An order for a Rabies titer is in Epic for 07/22/2021 when Anderson Malta had her labs drawn for her physical.  It shows pending for results.  [place] [phone] & the rep assisting me said [facility] didn't receive a requisition for the Rabies titer & they don't have that it was sent to the [facility] for processing.  Amisadai needs an appointment to redraw for the Rabies Titer (non-fasting).  AMD

## 2021-10-01 ENCOUNTER — Other Ambulatory Visit: Payer: Self-pay

## 2021-10-08 ENCOUNTER — Other Ambulatory Visit: Payer: 59

## 2021-10-08 DIAGNOSIS — Z0184 Encounter for antibody response examination: Secondary | ICD-10-CM

## 2021-10-08 NOTE — Progress Notes (Signed)
Pt presents today to have rabies titer checked.

## 2021-10-21 LAB — RABIES NEUT.ABS TITRAT.(RFFIT)

## 2021-10-29 DIAGNOSIS — C73 Malignant neoplasm of thyroid gland: Secondary | ICD-10-CM | POA: Diagnosis not present

## 2021-10-29 DIAGNOSIS — E890 Postprocedural hypothyroidism: Secondary | ICD-10-CM | POA: Diagnosis not present

## 2022-02-22 DIAGNOSIS — E890 Postprocedural hypothyroidism: Secondary | ICD-10-CM | POA: Diagnosis not present

## 2022-02-22 DIAGNOSIS — C73 Malignant neoplasm of thyroid gland: Secondary | ICD-10-CM | POA: Diagnosis not present

## 2022-03-03 DIAGNOSIS — C73 Malignant neoplasm of thyroid gland: Secondary | ICD-10-CM | POA: Diagnosis not present

## 2022-03-03 DIAGNOSIS — E890 Postprocedural hypothyroidism: Secondary | ICD-10-CM | POA: Diagnosis not present

## 2022-07-18 ENCOUNTER — Ambulatory Visit: Payer: Self-pay

## 2022-07-18 DIAGNOSIS — Z0000 Encounter for general adult medical examination without abnormal findings: Secondary | ICD-10-CM

## 2022-07-18 LAB — POCT URINALYSIS DIPSTICK
Bilirubin, UA: NEGATIVE
Blood, UA: POSITIVE
Glucose, UA: NEGATIVE
Ketones, UA: NEGATIVE
Leukocytes, UA: NEGATIVE
Nitrite, UA: NEGATIVE
Protein, UA: NEGATIVE
Spec Grav, UA: 1.015 (ref 1.010–1.025)
Urobilinogen, UA: 0.2 E.U./dL
pH, UA: 6 (ref 5.0–8.0)

## 2022-07-19 LAB — CMP12+LP+TP+TSH+6AC+CBC/D/PLT
ALT: 91 IU/L — ABNORMAL HIGH (ref 0–32)
AST: 50 IU/L — ABNORMAL HIGH (ref 0–40)
Albumin/Globulin Ratio: 1.4
Albumin: 3.9 g/dL (ref 3.8–4.9)
Alkaline Phosphatase: 75 IU/L (ref 44–121)
BUN/Creatinine Ratio: 13 (ref 9–23)
BUN: 10 mg/dL (ref 6–24)
Basophils Absolute: 0 10*3/uL (ref 0.0–0.2)
Basos: 0 %
Bilirubin Total: 0.2 mg/dL (ref 0.0–1.2)
Calcium: 9.4 mg/dL (ref 8.7–10.2)
Chloride: 102 mmol/L (ref 96–106)
Chol/HDL Ratio: 5 ratio — ABNORMAL HIGH (ref 0.0–4.4)
Cholesterol, Total: 232 mg/dL — ABNORMAL HIGH (ref 100–199)
Creatinine, Ser: 0.75 mg/dL (ref 0.57–1.00)
EOS (ABSOLUTE): 0.2 10*3/uL (ref 0.0–0.4)
Eos: 2 %
Estimated CHD Risk: 1.3 times avg. — ABNORMAL HIGH (ref 0.0–1.0)
Free Thyroxine Index: 3.2 (ref 1.2–4.9)
GGT: 16 IU/L (ref 0–60)
Globulin, Total: 2.7 g/dL (ref 1.5–4.5)
Glucose: 92 mg/dL (ref 70–99)
HDL: 46 mg/dL (ref >39)
Hematocrit: 38.3 % (ref 34.0–46.6)
Hemoglobin: 12.9 g/dL (ref 11.1–15.9)
Immature Grans (Abs): 0 10*3/uL (ref 0.0–0.1)
Immature Granulocytes: 0 %
Iron: 80 ug/dL (ref 27–159)
LDH: 230 IU/L — ABNORMAL HIGH (ref 119–226)
LDL Chol Calc (NIH): 133 mg/dL — ABNORMAL HIGH (ref 0–99)
Lymphocytes Absolute: 2.2 10*3/uL (ref 0.7–3.1)
Lymphs: 33 %
MCH: 29.2 pg (ref 26.6–33.0)
MCHC: 33.7 g/dL (ref 31.5–35.7)
MCV: 87 fL (ref 79–97)
Monocytes Absolute: 0.5 10*3/uL (ref 0.1–0.9)
Monocytes: 7 %
Neutrophils Absolute: 3.8 10*3/uL (ref 1.4–7.0)
Neutrophils: 58 %
Phosphorus: 4.2 mg/dL (ref 3.0–4.3)
Platelets: 369 10*3/uL (ref 150–450)
Potassium: 4.3 mmol/L (ref 3.5–5.2)
RBC: 4.42 x10E6/uL (ref 3.77–5.28)
RDW: 12.6 % (ref 11.7–15.4)
Sodium: 141 mmol/L (ref 134–144)
T3 Uptake Ratio: 30 % (ref 24–39)
T4, Total: 10.8 ug/dL (ref 4.5–12.0)
TSH: 0.231 u[IU]/mL — ABNORMAL LOW (ref 0.450–4.500)
Total Protein: 6.6 g/dL (ref 6.0–8.5)
Triglycerides: 294 mg/dL — ABNORMAL HIGH (ref 0–149)
Uric Acid: 5.1 mg/dL (ref 3.0–7.2)
VLDL Cholesterol Cal: 53 mg/dL — ABNORMAL HIGH (ref 5–40)
WBC: 6.6 10*3/uL (ref 3.4–10.8)
eGFR: 96 mL/min/{1.73_m2} (ref >59)

## 2022-07-21 ENCOUNTER — Ambulatory Visit: Payer: Self-pay | Admitting: Emergency Medicine

## 2022-07-21 VITALS — BP 139/93 | HR 92 | Temp 98.2°F | Resp 16 | Ht 68.0 in | Wt 263.0 lb

## 2022-07-21 DIAGNOSIS — R899 Unspecified abnormal finding in specimens from other organs, systems and tissues: Secondary | ICD-10-CM

## 2022-07-21 DIAGNOSIS — Z0000 Encounter for general adult medical examination without abnormal findings: Secondary | ICD-10-CM

## 2022-07-21 NOTE — Progress Notes (Signed)
Pt presents today to complete physical, Pt denies any issues or concerns at this time/CL,RMA 

## 2022-07-21 NOTE — Progress Notes (Signed)
HPI Alexa Flores is a 52 y.o. female is here of her annual physical exam.  She has been seeing an endocrinologist at [facility] for thyroid issues.  Her endocrinologist at this time wants to keep her hyper and she is aware that her TSH is 0.231 which is where her doctor wants to see her TSH.  Patient has history of thyroid cancer.  Patient reports increased energy and is actually been working out in the yard.  She is feeling much better with the change in her medication.       Past Medical History:  Diagnosis Date   Cancer ([facility]) 2017   thyroid ca.  Surgery only. No tx needed.    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 Authorizing Provider  calcium carbonate (TUMS EX) 750 MG chewable tablet Chew 2 tablets by mouth once. 11/24/15  Yes [provider]  cetirizine (ZYRTEC) 10 MG tablet Take 10 mg by mouth once.   Yes [provider]  Cholecalciferol 125 MCG (5000 UT) TABS Take 10,000 Units by mouth once.   Yes [provider]  diclofenac Sodium (VOLTAREN) 1 % GEL Apply 2 g topically 4 (four) times daily. 06/15/20  Yes Joni Reining, PA-C  furosemide (LASIX) 20 MG tablet Take 1 tablet (20 mg total) by mouth daily. 06/15/20  Yes Joni Reining, PA-C  ibuprofen (ADVIL) 200 MG tablet Take by mouth.   Yes [provider]  levothyroxine (SYNTHROID) 200 MCG tablet Take 200 mcg by mouth daily before  breakfast.   Yes [provider]  Lysine 500 MG TABS Take 1 tablet by mouth daily.   Yes [provider]  Omega-3 Fatty Acids (FISH OIL) 500 MG CAPS Take 1 capsule by mouth 1 day or 1 dose.   Yes [provider]  vitamin E 180 MG (400 UNITS) capsule Take 400 Units by mouth 1 day or 1 dose.   Yes [provider]    Allergies Azithromycin and Other  No family history on file.  Social History Social History   Tobacco Use   Smoking status: Never   Smokeless tobacco: Never    Review of Systems Constitutional: No fever/chills Eyes: No visual changes. ENT: No sore throat. Cardiovascular: Denies chest pain. Respiratory: Denies shortness of breath. Gastrointestinal: No abdominal pain.   Genitourinary: Negative for dysuria. Musculoskeletal: Negative for back pain. Skin: Negative for rash. Neurological: Negative for headaches, focal weakness or numbness. Endocrine: Hypothyroidism/hyperthyroidism.  ____________________________________________   PHYSICAL EXAM: Constitutional: Alert and oriented. Well appearing and in no acute distress. Eyes: Conjunctivae are normal. PERRL. EOMI. Head: Atraumatic. Nose: No congestion/rhinnorhea. Neck: No stridor.  No tenderness on palpation posteriorly. Cardiovascular: Normal rate, regular rhythm. Grossly normal heart sounds.  Good peripheral circulation. Respiratory: Normal respiratory effort.  No retractions. Lungs CTAB. Gastrointestinal: Soft and nontender.  Musculoskeletal: No edema noted lower extremities. Neurologic:  Normal speech and language. No gross focal neurologic deficits are appreciated. No gait instability. Skin:  Skin is warm, dry and intact. No rash noted. Psychiatric: Mood and affect are normal. Speech and behavior are normal.  ____________________________________________   LABS (all labs ordered are listed, but only abnormal results are displayed)  Labs were discussed including TSH,  cholesterol, triglycerides, liver functions. ____________________________________________  EKG  Sinus rhythm with a ventricular rate of 70.   FINAL CLINICAL IMPRESSION(S)   Annual Physical Exam Thyroid CA Abnormal Labs   Patient will continue to see her endocrinologist at [facility].   ED Discharge Orders     None        Note:  This document was prepared using Dragon voice recognition software and may include unintentional dictation errors.

## 2022-10-28 ENCOUNTER — Other Ambulatory Visit: Payer: 59

## 2022-11-29 ENCOUNTER — Other Ambulatory Visit: Payer: Self-pay

## 2022-11-29 ENCOUNTER — Ambulatory Visit: Payer: Self-pay | Admitting: Physician Assistant

## 2022-11-29 ENCOUNTER — Ambulatory Visit: Payer: Self-pay

## 2022-11-29 ENCOUNTER — Encounter: Payer: Self-pay | Admitting: Physician Assistant

## 2022-11-29 VITALS — BP 144/96 | HR 80 | Temp 98.2°F | Resp 12 | Ht 68.0 in | Wt 261.0 lb

## 2022-11-29 DIAGNOSIS — M7989 Other specified soft tissue disorders: Secondary | ICD-10-CM

## 2022-11-29 DIAGNOSIS — Z021 Encounter for pre-employment examination: Secondary | ICD-10-CM

## 2022-11-29 DIAGNOSIS — Z0289 Encounter for other administrative examinations: Secondary | ICD-10-CM

## 2022-11-29 LAB — POCT URINALYSIS DIPSTICK
Bilirubin, UA: NEGATIVE
Blood, UA: NEGATIVE
Glucose, UA: NEGATIVE
Ketones, UA: NEGATIVE
Leukocytes, UA: NEGATIVE
Nitrite, UA: NEGATIVE
Protein, UA: NEGATIVE
Spec Grav, UA: <=1.005 — AB (ref 1.010–1.025)
Urobilinogen, UA: 0.2 U/dL
pH, UA: 7 (ref 5.0–8.0)

## 2022-11-29 MED ORDER — FUROSEMIDE 20 MG PO TABS: 20.0000 mg | ORAL_TABLET | Freq: Every day | ORAL | 3 refills | Status: AC

## 2022-11-29 NOTE — Progress Notes (Signed)
[facility]  ____________________________________________   None    (approximate)  I have reviewed the triage vital signs and the nursing notes.   HISTORY  Chief Complaint Employment Physical ([employer])   HPI Alexa Flores is a 52 y.o. female patient presents for preemployment physical for [employer].  Voices no concerns or complaints.  Patient is followed by endocrinology secondary to thyroid issues.         Past Medical History:  Diagnosis Date   Cancer ([facility]) 2017   thyroid ca.  Surgery only. No tx needed.    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 Authorizing Provider  calcium carbonate (TUMS EX) 750 MG chewable tablet Chew 2 tablets by mouth once. 11/24/15  Yes [provider]  cetirizine (ZYRTEC) 10 MG tablet Take 10 mg by mouth once.   Yes [provider]  Cholecalciferol 125 MCG (5000 UT) TABS Take 10,000 Units by mouth once.   Yes [provider]  levothyroxine (SYNTHROID) 200 MCG tablet Take 200 mcg by mouth daily before breakfast.   Yes [provider]  Omega-3 Fatty Acids (FISH OIL) 500 MG CAPS Take 1 capsule by mouth 1 day or 1 dose.   Yes [provider]  diclofenac Sodium (VOLTAREN) 1 % GEL Apply 2 g topically 4 (four) times daily. 06/15/20   Joni Reining, PA-C  furosemide (LASIX) 20  MG tablet Take 1 tablet (20 mg total) by mouth daily. 06/15/20   Joni Reining, PA-C  ibuprofen (ADVIL) 200 MG tablet Take by mouth.    [provider]  Lysine 500 MG TABS Take 1 tablet by mouth daily.    [provider]  vitamin E 180 MG (400 UNITS) capsule Take 400 Units by mouth 1 day or 1 dose. Patient not taking: Reported on 11/29/2022    [provider]    Allergies Influenza vaccines, Azithromycin, and Other  No family history on file.  Social History Social History   Tobacco Use   Smoking status: Never   Smokeless tobacco: Never    Review of Systems Constitutional: No fever/chills Eyes: No visual changes. ENT: No sore throat. Cardiovascular: Denies chest pain. Respiratory: Denies shortness of breath. Gastrointestinal: No abdominal pain.  No nausea, no vomiting.  No diarrhea.  No constipation. Genitourinary: Negative for dysuria. Musculoskeletal: Negative for back pain. Skin: Negative for rash. Neurological: Negative for headaches, focal weakness or numbness. Endocrine: Hyperlipidemia and hypothyroidism Allergic/Immunilogical: Azithromycin and influenza vaccine ____________________________________________   PHYSICAL EXAM:  VITAL SIGNS: BP Location Left Arm  Patient Position Sitting  Cuff Size Large  Pulse 80  Resp 12  Temp 98.2 F (36.8 C)  Temp src Oral  SpO2 97 %  Weight 261 lb (118.4 kg)  Height 5\' 8"  (1.727 m)   BMI  39.68 kg/m2  BSA 2.38 m2   Constitutional: Alert and oriented. Well appearing and in no acute distress. Eyes: Conjunctivae are normal. PERRL. EOMI. Head: Atraumatic. Nose: No congestion/rhinnorhea. Mouth/Throat: Mucous membranes are moist.  Oropharynx non-erythematous. Neck: No stridor.  No cervical spine tenderness to palpation. Hematological/Lymphatic/Immunilogical: No cervical lymphadenopathy. Cardiovascular: Normal rate, regular rhythm. Grossly normal heart sounds.  Good peripheral  circulation. Respiratory: Normal respiratory effort.  No retractions. Lungs CTAB. Gastrointestinal: Soft and nontender. No distention. No abdominal bruits. No CVA tenderness. Genitourinary: Deferred Musculoskeletal: No lower extremity tenderness nor edema.  No joint effusions. Neurologic:  Normal speech and language. No gross focal neurologic deficits are appreciated. No gait instability. Skin:  Skin is warm, dry and intact. No rash noted. Psychiatric: Mood and affect are normal. Speech and behavior are normal.  ____________________________________________   LABS Pending ____________________________________________  EKG  Sinus rhythm at 70 bpm ____________________________________________  RADIOLOGY   ____________________________________________   INITIAL IMPRESSION / ASSESSMENT AND PLAN / ED COURSE  As part of my medical decision making, I reviewed the following data within the electronic MEDICAL RECORD NUMBER       No acute findings on physical exam or EKG.  Labs pending.     ____________________________________________   FINAL CLINICAL IMPRESSION Well exam pending lab results.   ED Discharge Orders     None        Note:  This document was prepared using Dragon voice recognition software and may include unintentional dictation errors.

## 2022-12-02 LAB — CMP12+LP+TP+TSH+6AC+CBC/D/PLT
ALT: 31 [IU]/L (ref 0–32)
AST: 18 [IU]/L (ref 0–40)
Albumin: 4.3 g/dL (ref 3.8–4.9)
Alkaline Phosphatase: 61 [IU]/L (ref 44–121)
BUN/Creatinine Ratio: 14 (ref 9–23)
BUN: 13 mg/dL (ref 6–24)
Basophils Absolute: 0 10*3/uL (ref 0.0–0.2)
Basos: 1 %
Bilirubin Total: 0.4 mg/dL (ref 0.0–1.2)
Calcium: 9 mg/dL (ref 8.7–10.2)
Chloride: 101 mmol/L (ref 96–106)
Chol/HDL Ratio: 4 ratio (ref 0.0–4.4)
Cholesterol, Total: 240 mg/dL — ABNORMAL HIGH (ref 100–199)
Creatinine, Ser: 0.9 mg/dL (ref 0.57–1.00)
EOS (ABSOLUTE): 0.2 10*3/uL (ref 0.0–0.4)
Eos: 2 %
Estimated CHD Risk: 0.9 times avg. (ref 0.0–1.0)
Free Thyroxine Index: 3.3 (ref 1.2–4.9)
GGT: 9 [IU]/L (ref 0–60)
Globulin, Total: 2.7 g/dL (ref 1.5–4.5)
Glucose: 83 mg/dL (ref 70–99)
HDL: 60 mg/dL (ref >39)
Hematocrit: 42.9 % (ref 34.0–46.6)
Hemoglobin: 14.1 g/dL (ref 11.1–15.9)
Immature Grans (Abs): 0.1 10*3/uL (ref 0.0–0.1)
Immature Granulocytes: 1 %
Iron: 69 ug/dL (ref 27–159)
LDH: 229 [IU]/L — ABNORMAL HIGH (ref 119–226)
LDL Chol Calc (NIH): 164 mg/dL — ABNORMAL HIGH (ref 0–99)
Lymphocytes Absolute: 3.1 10*3/uL (ref 0.7–3.1)
Lymphs: 39 %
MCH: 29.6 pg (ref 26.6–33.0)
MCHC: 32.9 g/dL (ref 31.5–35.7)
MCV: 90 fL (ref 79–97)
Monocytes Absolute: 0.5 10*3/uL (ref 0.1–0.9)
Monocytes: 6 %
Neutrophils Absolute: 4.2 10*3/uL (ref 1.4–7.0)
Neutrophils: 51 %
Phosphorus: 3.8 mg/dL (ref 3.0–4.3)
Platelets: 410 10*3/uL (ref 150–450)
Potassium: 4 mmol/L (ref 3.5–5.2)
RBC: 4.76 x10E6/uL (ref 3.77–5.28)
RDW: 12.7 % (ref 11.7–15.4)
Sodium: 138 mmol/L (ref 134–144)
T3 Uptake Ratio: 30 % (ref 24–39)
T4, Total: 10.9 ug/dL (ref 4.5–12.0)
TSH: 2.85 u[IU]/mL (ref 0.450–4.500)
Total Protein: 7 g/dL (ref 6.0–8.5)
Triglycerides: 92 mg/dL (ref 0–149)
Uric Acid: 5 mg/dL (ref 3.0–7.2)
VLDL Cholesterol Cal: 16 mg/dL (ref 5–40)
WBC: 8.1 10*3/uL (ref 3.4–10.8)
eGFR: 77 mL/min/{1.73_m2} (ref >59)

## 2022-12-02 LAB — QUANTIFERON-TB GOLD PLUS
QuantiFERON Mitogen Value: >10 [IU]/mL
QuantiFERON Nil Value: 0 [IU]/mL
QuantiFERON TB1 Ag Value: 0 [IU]/mL
QuantiFERON TB2 Ag Value: 0 [IU]/mL
QuantiFERON-TB Gold Plus: NEGATIVE

## 2023-02-28 DIAGNOSIS — C73 Malignant neoplasm of thyroid gland: Secondary | ICD-10-CM | POA: Diagnosis not present

## 2023-02-28 DIAGNOSIS — E890 Postprocedural hypothyroidism: Secondary | ICD-10-CM | POA: Diagnosis not present

## 2023-03-03 DIAGNOSIS — E890 Postprocedural hypothyroidism: Secondary | ICD-10-CM | POA: Diagnosis not present

## 2023-03-03 DIAGNOSIS — C73 Malignant neoplasm of thyroid gland: Secondary | ICD-10-CM | POA: Diagnosis not present

## 2023-04-13 ENCOUNTER — Ambulatory Visit: Payer: Self-pay

## 2023-04-13 DIAGNOSIS — Z0110 Encounter for examination of ears and hearing without abnormal findings: Secondary | ICD-10-CM

## 2023-04-13 NOTE — Progress Notes (Signed)
 Presents to [facility] at request of supervisor for follow-up hearing screen.  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  States she's trying a new ear piece that inserts into ear & it is working better than the style that goes over the outer ear.  11/29/2022 Tele[employer] PE - hearing tested by whisper test by Nona Dell, PA-C.  Baseline hearing screen in [employer] database from 04/12/2013 when Alexa Flores was originally hired by [employer].  Hearing screen completed & compared to the 04/12/2013 test. Normal screen - all frequencies tested WNL  [name], RN sending supervisor an email letting him know hearing screen WNL.

## 2023-11-09 IMAGING — CR DG KNEE COMPLETE 4+V*L*
1 series · 4 of 4 positions shown · non-contrast
Comparison: None.

CLINICAL DATA: Three weeks of increasing left knee pain. Heard a
pop and then felt burning in the anterior left knee when walking 3
weeks ago.

EXAM:
LEFT KNEE - COMPLETE 4+ VIEW

[Series 1: dg knee complete 4 views left · 0.14mm/px · 4 of 4 slices shown]
[im 1/4]
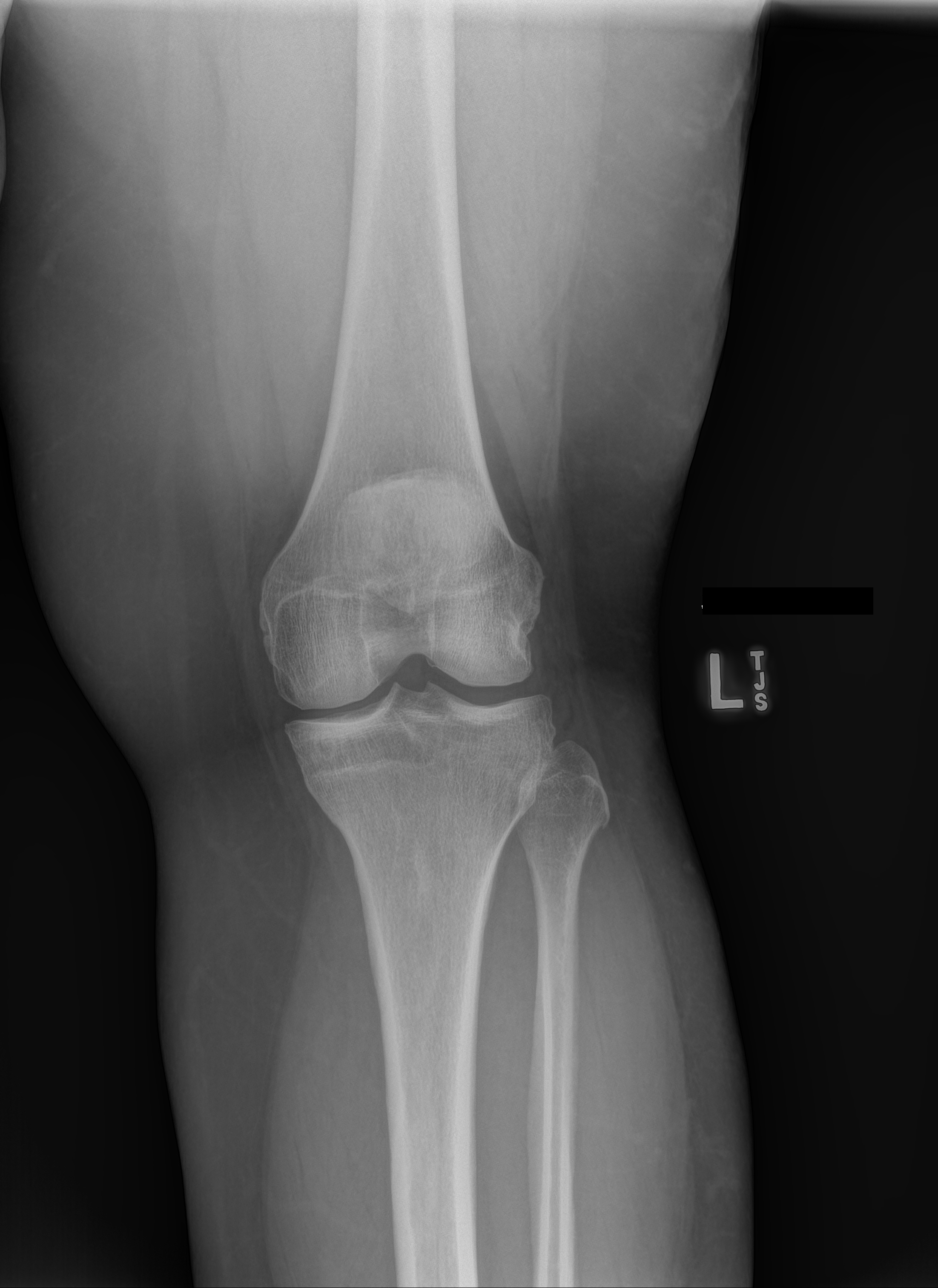
[im 2/4]
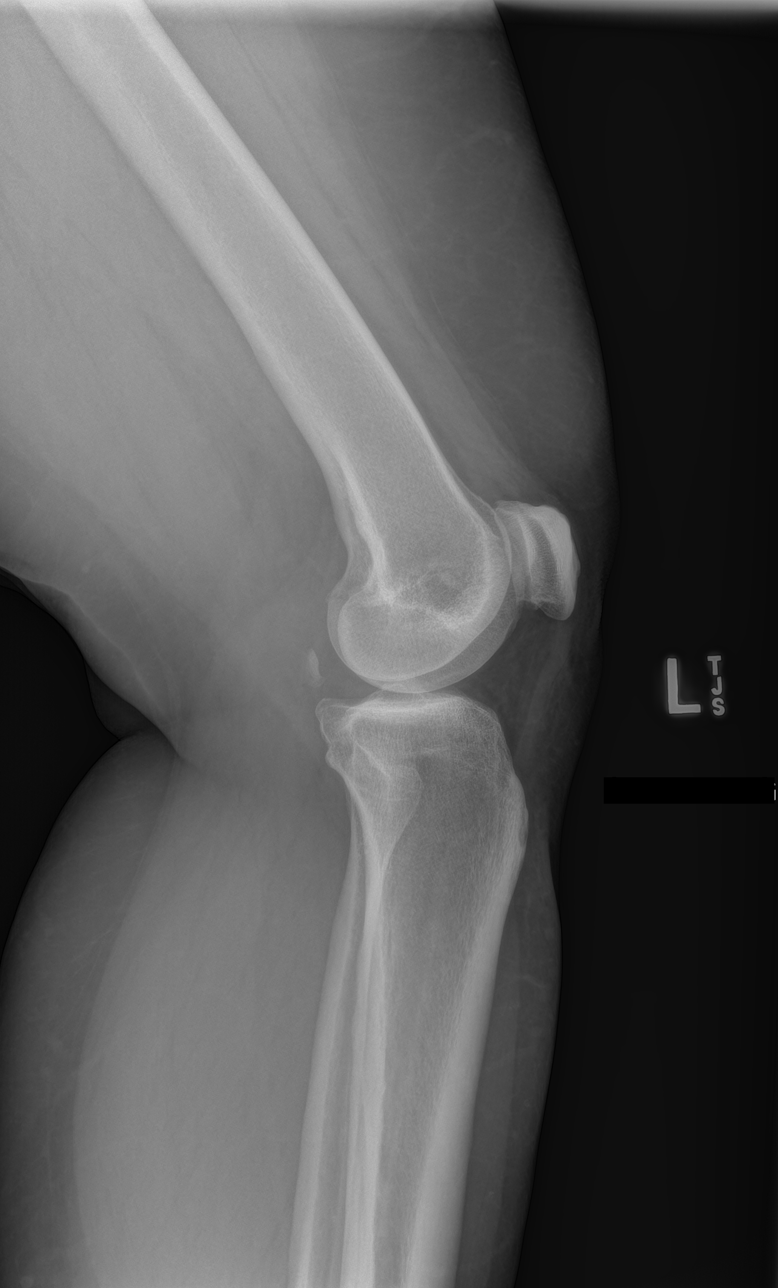
[im 3/4]
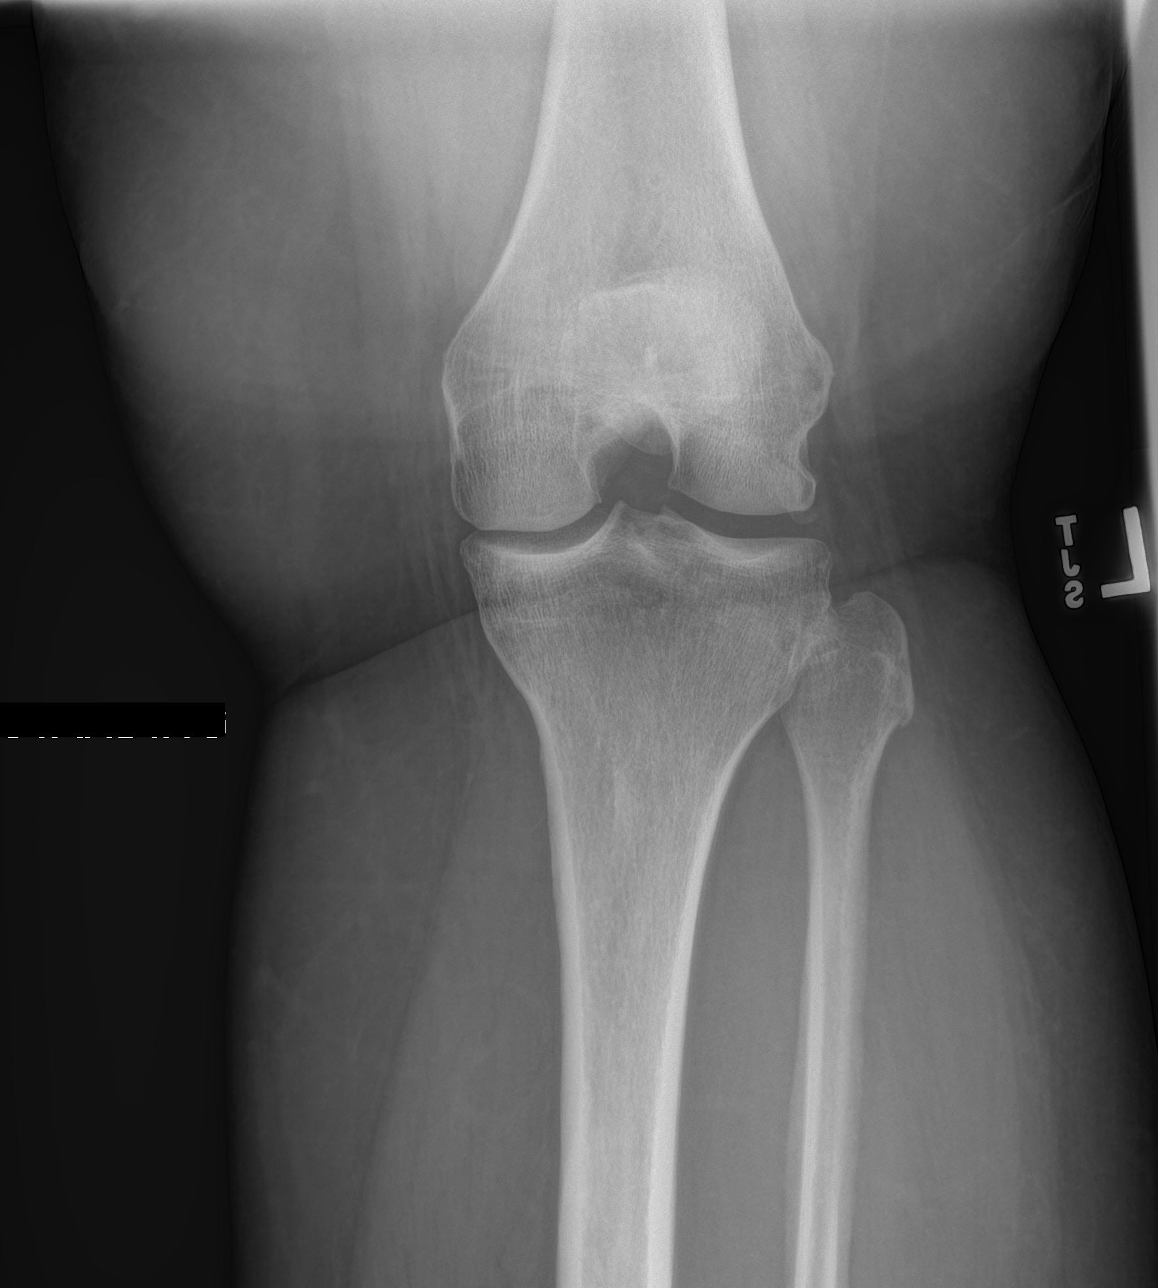
[im 4/4]
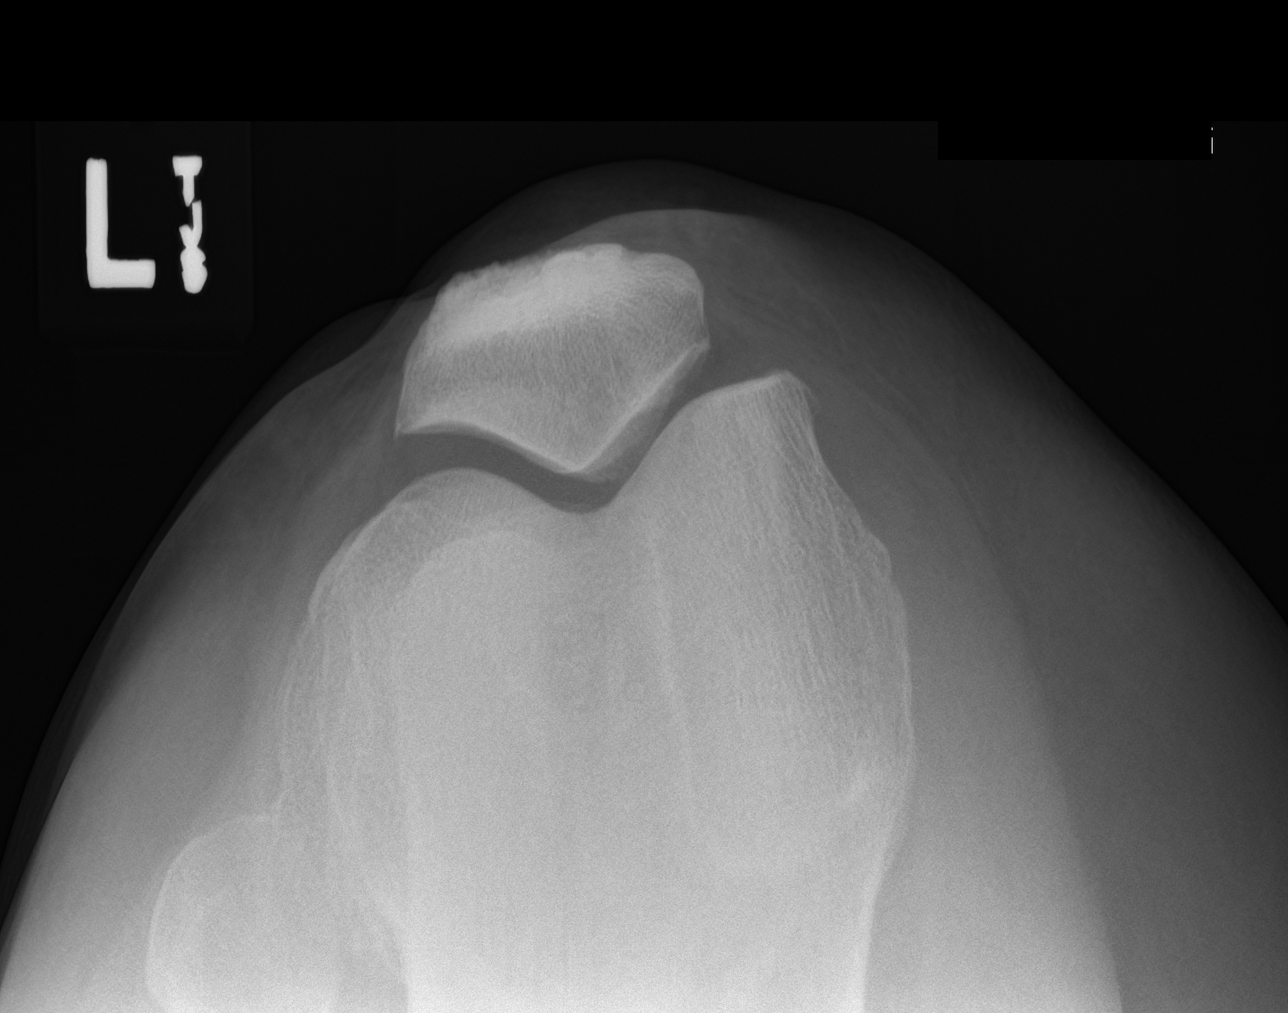

[4 of 4 positions shown; findings below may reference images not displayed]

FINDINGS: Mild-to-moderate medial compartment joint space narrowing. No joint
effusion. Mild superior and lateral patellar degenerative
osteophytosis. No acute fracture or dislocation.
IMPRESSION: Mild medial and patellofemoral compartment osteoarthritis.
